# Patient Record
Sex: Male | Born: 1955 | Race: White | Hispanic: No | Marital: Married | Smoking: Never smoker
Health system: Southern US, Community
[De-identification: ages and names within clinical notes are randomized; demographics above are authoritative.]

## PROBLEM LIST (undated history)

## (undated) DIAGNOSIS — E7800 Pure hypercholesterolemia, unspecified: Secondary | ICD-10-CM

## (undated) DIAGNOSIS — I10 Essential (primary) hypertension: Secondary | ICD-10-CM

## (undated) DIAGNOSIS — F419 Anxiety disorder, unspecified: Secondary | ICD-10-CM

## (undated) HISTORY — PX: TONSILLECTOMY: SUR1361

## (undated) HISTORY — PX: CHOLECYSTECTOMY: SHX55

## (undated) HISTORY — PX: OTHER SURGICAL HISTORY: SHX169

## (undated) HISTORY — PX: NASAL SEPTUM SURGERY: SHX37

## (undated) HISTORY — PX: PALATE / UVULA BIOPSY / EXCISION: SUR128

---

## 2001-05-20 DIAGNOSIS — I10 Essential (primary) hypertension: Secondary | ICD-10-CM | POA: Insufficient documentation

## 2003-10-16 DIAGNOSIS — E291 Testicular hypofunction: Secondary | ICD-10-CM | POA: Insufficient documentation

## 2006-09-17 ENCOUNTER — Ambulatory Visit: Payer: Self-pay | Admitting: Gastroenterology

## 2006-12-21 LAB — HM COLONOSCOPY: HM COLON: NORMAL

## 2007-01-28 DIAGNOSIS — G4733 Obstructive sleep apnea (adult) (pediatric): Secondary | ICD-10-CM | POA: Insufficient documentation

## 2007-06-13 ENCOUNTER — Other Ambulatory Visit: Payer: Self-pay

## 2007-06-13 ENCOUNTER — Observation Stay: Payer: Self-pay | Admitting: Unknown Physician Specialty

## 2007-10-22 ENCOUNTER — Ambulatory Visit: Payer: Self-pay | Admitting: Pain Medicine

## 2007-10-29 ENCOUNTER — Ambulatory Visit: Payer: Self-pay | Admitting: Pain Medicine

## 2008-01-08 ENCOUNTER — Ambulatory Visit: Payer: Self-pay | Admitting: Pain Medicine

## 2008-01-22 ENCOUNTER — Ambulatory Visit: Payer: Self-pay | Admitting: Physician Assistant

## 2008-02-19 ENCOUNTER — Ambulatory Visit: Payer: Self-pay | Admitting: Pain Medicine

## 2008-02-23 ENCOUNTER — Ambulatory Visit: Payer: Self-pay | Admitting: Pain Medicine

## 2008-02-26 ENCOUNTER — Ambulatory Visit: Payer: Self-pay | Admitting: Pain Medicine

## 2008-05-12 ENCOUNTER — Ambulatory Visit: Payer: Self-pay | Admitting: Unknown Physician Specialty

## 2008-07-22 IMAGING — CR DG CHEST 2V
1 series · 2 of 2 positions shown · non-contrast
Comparison: none

REASON FOR EXAM: preop
COMMENTS:

PROCEDURE:     DXR - DXR CHEST PA (OR AP) AND LATERAL  - June 13, 2007 [DATE]
RESULT:     The lung fields are clear. The heart, mediastinal and osseous
structures show no significant abnormalities. Incidental note is made of
mild degenerative spurring at the T10-T11 level of the thoracic spine.

[Series 1: view not recorded · 0.17mm/px · 2 of 2 slices shown]
[im 1/2]
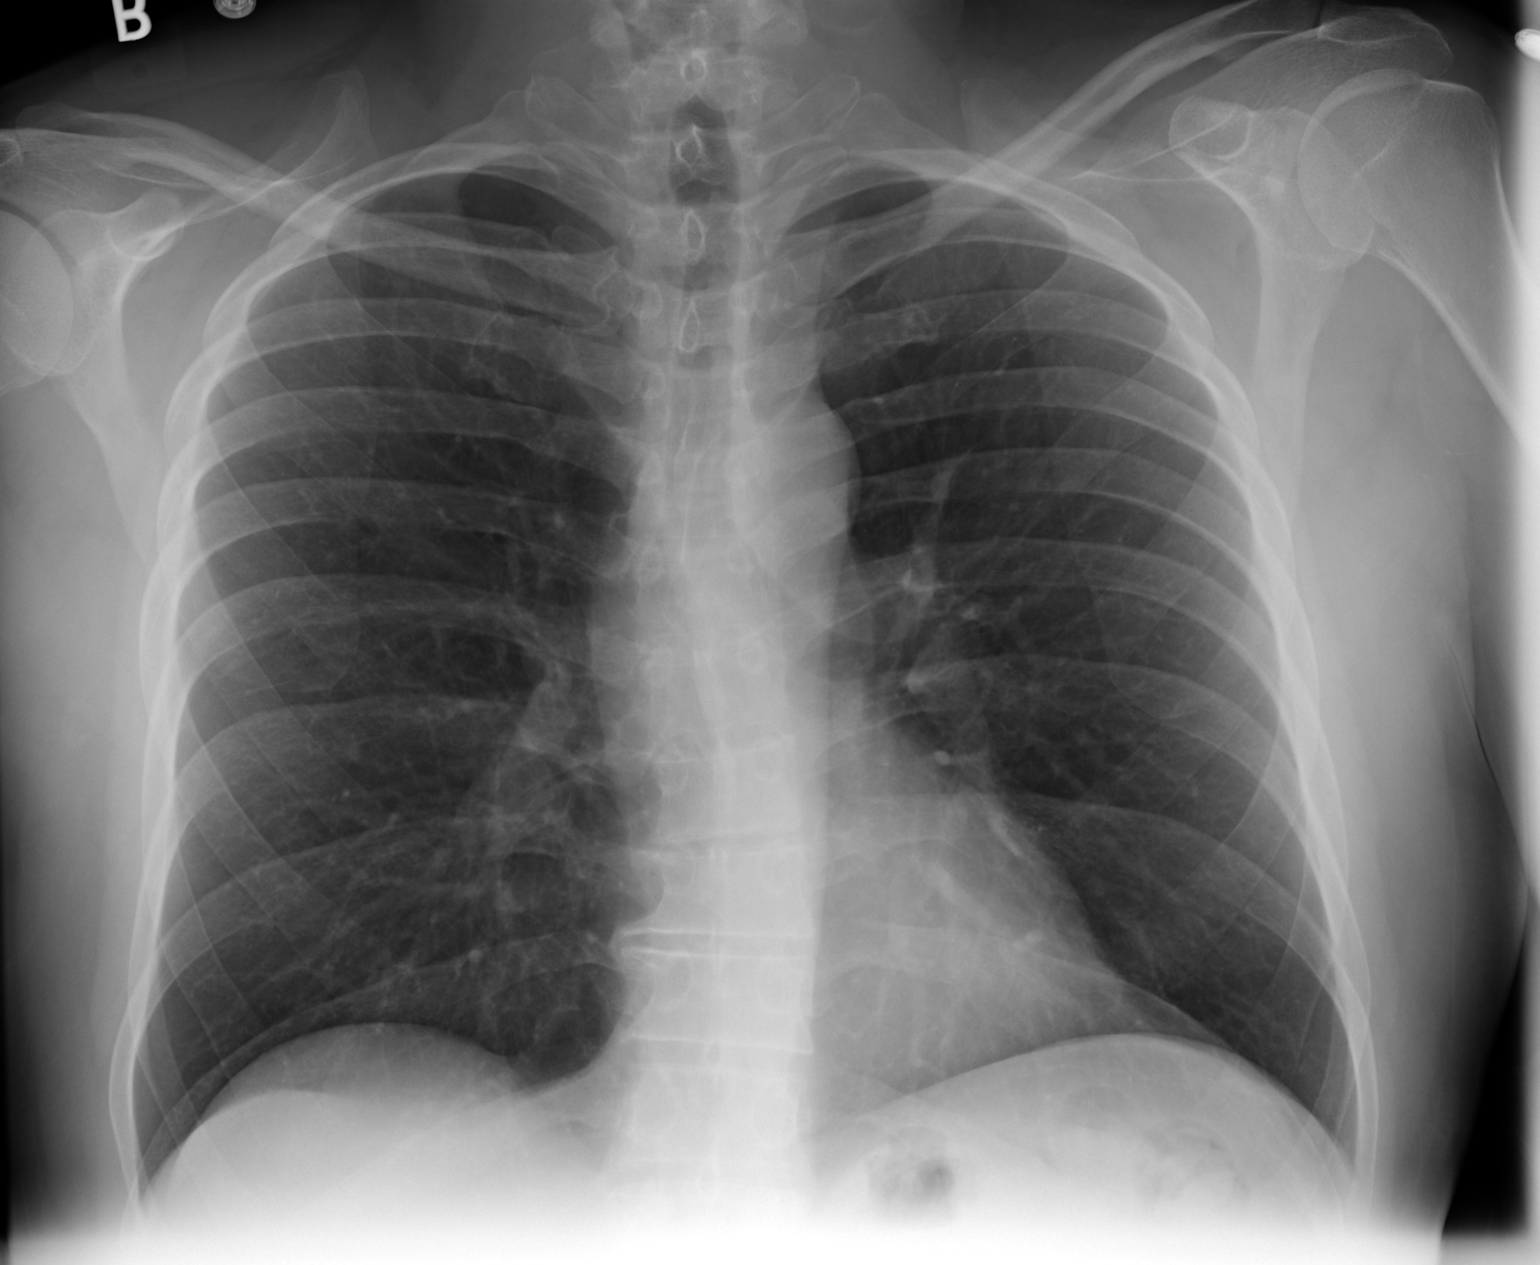
[im 2/2]
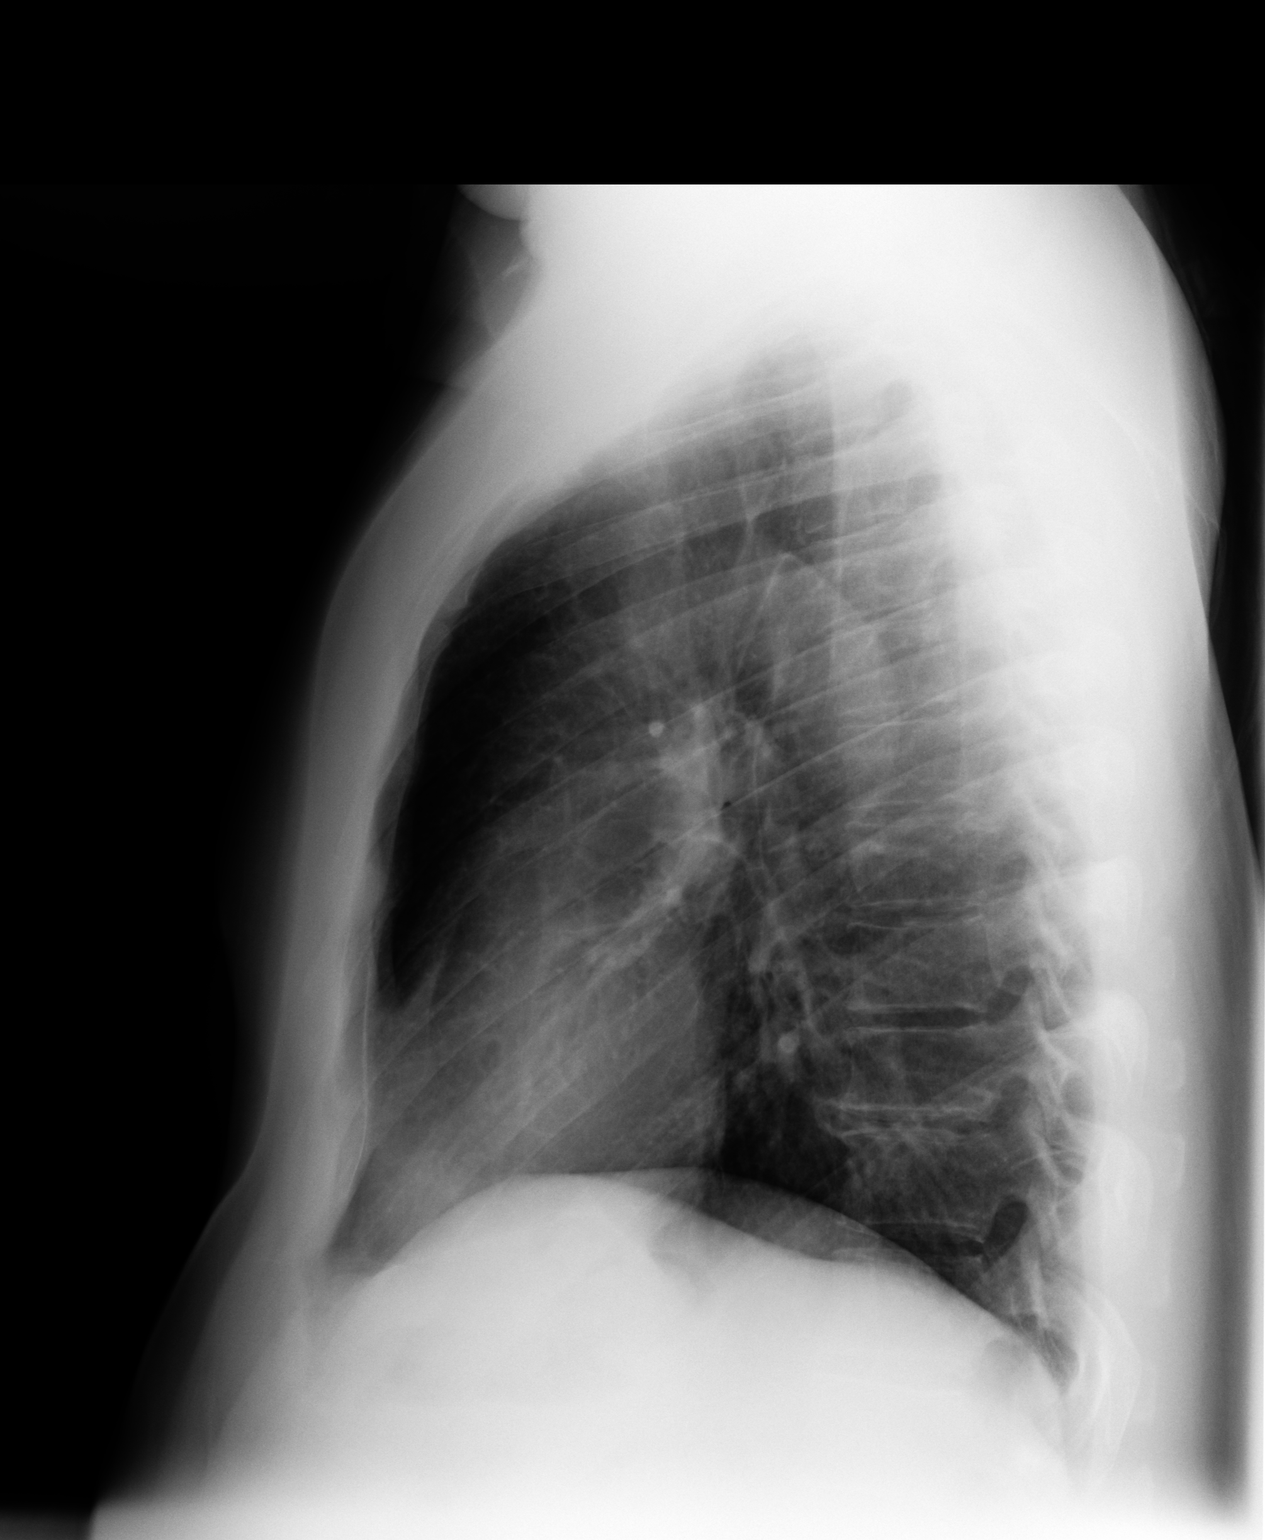

[2 of 2 positions shown; findings below may reference images not displayed]

IMPRESSION: 1.     No acute changes are identified.

## 2009-06-21 IMAGING — CR DG ARTHROGRAM WRIST*R*
1 series · 1 of 1 positions shown · non-contrast
Comparison: No comparison

REASON FOR EXAM: right wrist pain
COMMENTS:

PROCEDURE:     FL  - FL INJEC PROC RT WRIST ART  - May 12, 2008 [DATE]
RESULT:     Examination: Right wrist arthrogram
HISTORY: Prior right distal forearm trauma
TECHNIQUE: The risks and benefits of the procedure were discussed with the patient, and
written informed consent was obtained. The patient stated no history of
allergy to contrast media. A formal timeout procedure was performed with the
patient according to departmental protocol.

[view not recorded]
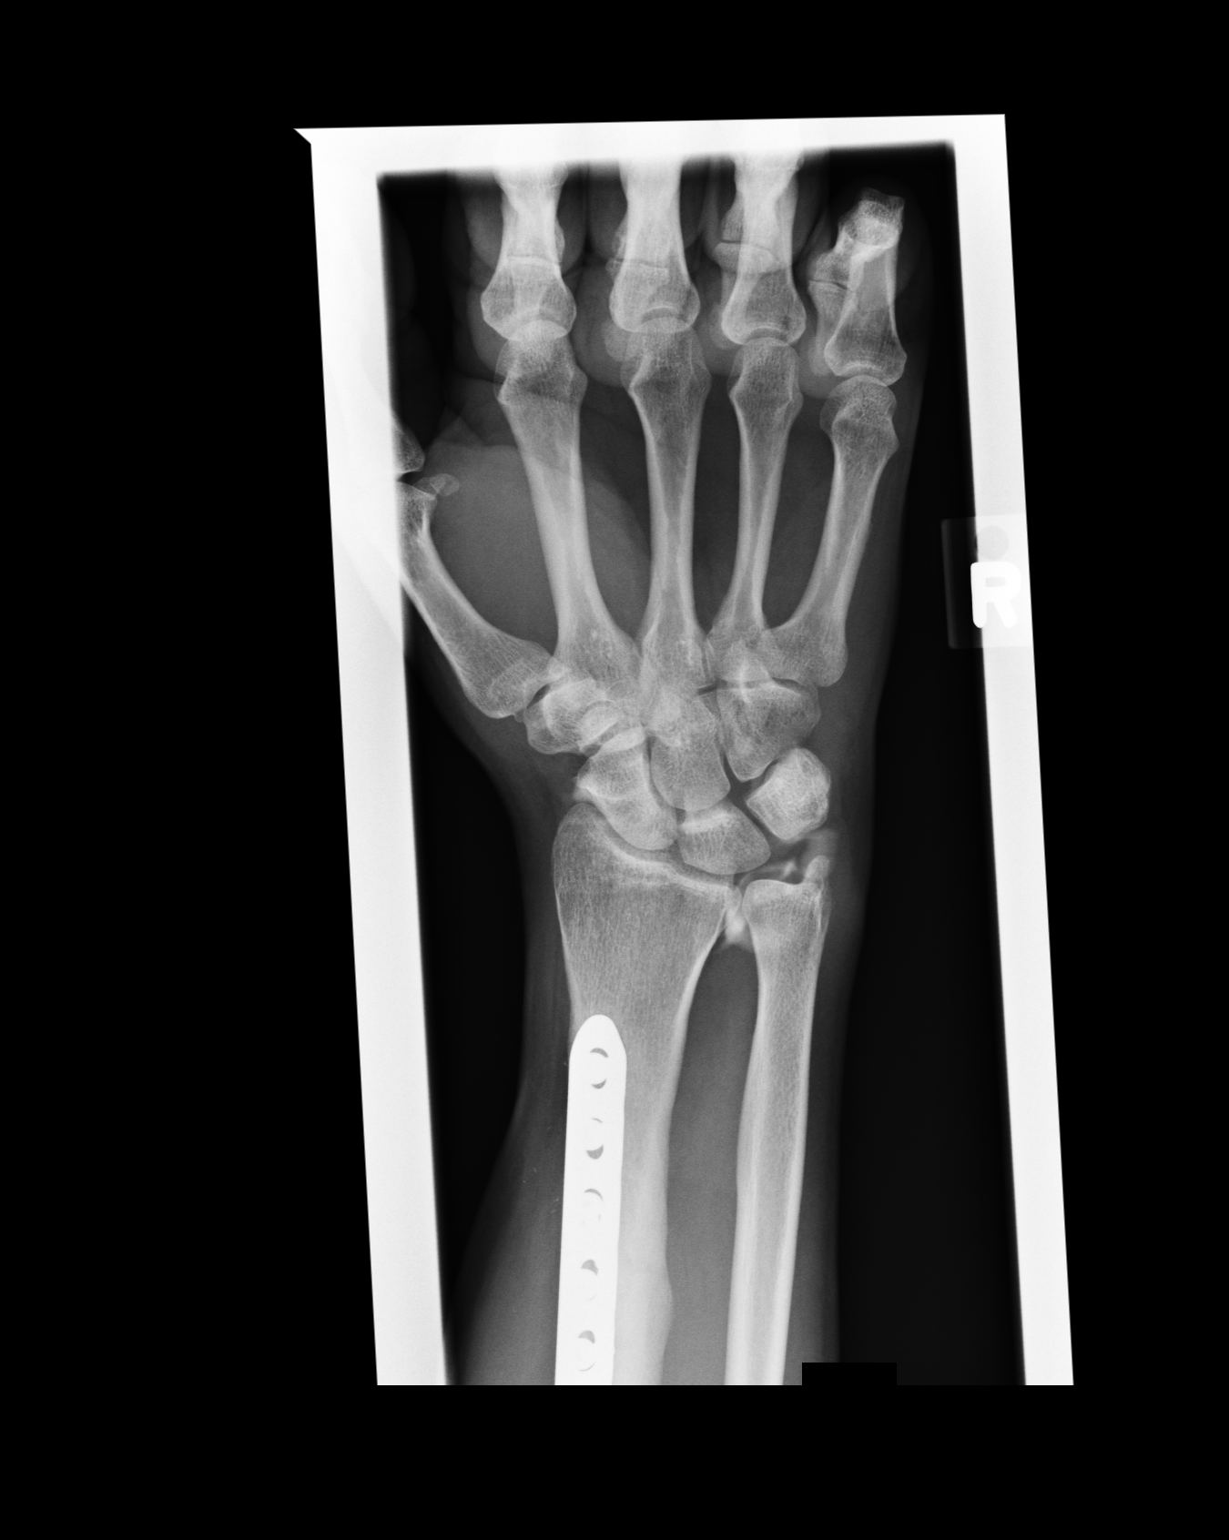

[1 of 1 positions shown; findings below may reference images not displayed]

The patient was placed supine on the fluoroscopy table and the right
radioscaphoid joint was identified under fluoroscopy. The skin overlying the
right radioscaphoid joint was subsequently cleaned with chloraprep and a
sterile drape was placed over the area of interest. Local anesthetic
(lidocaine) was used to numb the skin around the needle insertion site.

A 20-gauge needle was inserted into the right radioscaphoid joint under
fluoroscopy. Position was confirmed with injection of a small amount of
Optiray under fluoroscopy.

2 ml of gadolinium mixture (0.1 ml of gadolinium mixed with 20 ml of sterile
saline) was injected into the right radioscaphoid joint.

The wrist was exercised and contrast was seen extending into the distal
radial ulnar joint consistent with a tear of the TFC.

The needle was removed and hemostasis was achieved.The patient was
subsequently transferred to MRI for imaging.
IMPRESSION: Successful right wrist arthrogram for MRI without complication. Contrast is
seen extending from the radiocarpal joint into the distal radial ulnar joint
most consistent with a TFC tear.

## 2011-09-27 HISTORY — PX: UPPER GI ENDOSCOPY: SHX6162

## 2014-12-20 LAB — BASIC METABOLIC PANEL
BUN: 18 mg/dL (ref 4–21)
Creatinine: 1.1 mg/dL (ref 0.6–1.3)
GLUCOSE: 104 mg/dL
Sodium: 138 mmol/L (ref 137–147)

## 2014-12-20 LAB — LIPID PANEL
Cholesterol: 158 mg/dL (ref 0–200)
HDL: 58 mg/dL (ref 35–70)
LDL CALC: 75 mg/dL
LDL/HDL RATIO: 1.3
Triglycerides: 127 mg/dL (ref 40–160)

## 2015-12-19 ENCOUNTER — Telehealth: Payer: Self-pay

## 2015-12-19 NOTE — Telephone Encounter (Signed)
Patient reports upper mid abdominal pain, bloating, and a significant amount of blood in stool this morning. Patient denies dizziness, nausea, and syncope.Discussed with Dr. Sherrie MustacheFisher. Per Dr. Sherrie MustacheFisher advised patient to go to the ER for further evaluation and a CT scan to rule out blooding diverticulitis. Patient verbalized understanding, but states he was going to call his insurance company to determine coverage of a ER visit.

## 2015-12-20 ENCOUNTER — Ambulatory Visit
Admission: EM | Admit: 2015-12-20 | Discharge: 2015-12-20 | Disposition: A | Discharge status: Home / Self Care | Payer: 59 | Attending: Family Medicine | Admitting: Family Medicine

## 2015-12-20 ENCOUNTER — Emergency Department
Admission: EM | Admit: 2015-12-20 | Discharge: 2015-12-20 | Disposition: A | Discharge status: Home / Self Care | Payer: 59 | Attending: Emergency Medicine | Admitting: Emergency Medicine

## 2015-12-20 ENCOUNTER — Other Ambulatory Visit: Payer: Self-pay

## 2015-12-20 ENCOUNTER — Emergency Department: Payer: 59

## 2015-12-20 DIAGNOSIS — K625 Hemorrhage of anus and rectum: Secondary | ICD-10-CM | POA: Diagnosis not present

## 2015-12-20 DIAGNOSIS — R197 Diarrhea, unspecified: Secondary | ICD-10-CM | POA: Diagnosis not present

## 2015-12-20 DIAGNOSIS — K649 Unspecified hemorrhoids: Secondary | ICD-10-CM | POA: Diagnosis not present

## 2015-12-20 DIAGNOSIS — R1084 Generalized abdominal pain: Secondary | ICD-10-CM

## 2015-12-20 DIAGNOSIS — R1013 Epigastric pain: Secondary | ICD-10-CM | POA: Diagnosis present

## 2015-12-20 DIAGNOSIS — K219 Gastro-esophageal reflux disease without esophagitis: Secondary | ICD-10-CM

## 2015-12-20 DIAGNOSIS — R1310 Dysphagia, unspecified: Secondary | ICD-10-CM | POA: Diagnosis not present

## 2015-12-20 DIAGNOSIS — Z79899 Other long term (current) drug therapy: Secondary | ICD-10-CM | POA: Insufficient documentation

## 2015-12-20 DIAGNOSIS — I10 Essential (primary) hypertension: Secondary | ICD-10-CM | POA: Insufficient documentation

## 2015-12-20 HISTORY — DX: Anxiety disorder, unspecified: F41.9

## 2015-12-20 HISTORY — DX: Pure hypercholesterolemia, unspecified: E78.00

## 2015-12-20 HISTORY — DX: Essential (primary) hypertension: I10

## 2015-12-20 LAB — CBC WITH DIFFERENTIAL/PLATELET
BASOS ABS: 0 10*3/uL (ref 0–0.1)
Basophils Relative: 0 %
EOS ABS: 0.2 10*3/uL (ref 0–0.7)
Eosinophils Relative: 2 %
HEMATOCRIT: 39.8 % — AB (ref 40.0–52.0)
Hemoglobin: 13.8 g/dL (ref 13.0–18.0)
LYMPHS ABS: 4.4 10*3/uL — AB (ref 1.0–3.6)
Lymphocytes Relative: 52 %
MCH: 31 pg (ref 26.0–34.0)
MCHC: 34.7 g/dL (ref 32.0–36.0)
MCV: 89.3 fL (ref 80.0–100.0)
MONOS PCT: 13 %
Monocytes Absolute: 1.1 10*3/uL — ABNORMAL HIGH (ref 0.2–1.0)
NEUTROS ABS: 2.8 10*3/uL (ref 1.4–6.5)
Neutrophils Relative %: 33 %
PLATELETS: 300 10*3/uL (ref 150–440)
RBC: 4.46 MIL/uL (ref 4.40–5.90)
RDW: 13.6 % (ref 11.5–14.5)
WBC: 8.5 10*3/uL (ref 3.8–10.6)

## 2015-12-20 LAB — COMPREHENSIVE METABOLIC PANEL
ALK PHOS: 143 U/L — AB (ref 38–126)
ALT: 44 U/L (ref 17–63)
AST: 42 U/L — AB (ref 15–41)
Albumin: 3 g/dL — ABNORMAL LOW (ref 3.5–5.0)
Anion gap: 6 (ref 5–15)
BILIRUBIN TOTAL: 0.7 mg/dL (ref 0.3–1.2)
BUN: 19 mg/dL (ref 6–20)
CHLORIDE: 102 mmol/L (ref 101–111)
CO2: 26 mmol/L (ref 22–32)
Calcium: 7.6 mg/dL — ABNORMAL LOW (ref 8.9–10.3)
Creatinine, Ser: 1.16 mg/dL (ref 0.61–1.24)
GFR calc Af Amer: >60 mL/min (ref >60)
GLUCOSE: 121 mg/dL — AB (ref 65–99)
Potassium: 4.1 mmol/L (ref 3.5–5.1)
Sodium: 134 mmol/L — ABNORMAL LOW (ref 135–145)
Total Protein: 6 g/dL — ABNORMAL LOW (ref 6.5–8.1)

## 2015-12-20 LAB — TROPONIN I

## 2015-12-20 LAB — ABO/RH: ABO/RH(D): A POS

## 2015-12-20 LAB — CBC
HEMATOCRIT: 40.7 % (ref 40.0–52.0)
HEMOGLOBIN: 13.7 g/dL (ref 13.0–18.0)
MCH: 29.8 pg (ref 26.0–34.0)
MCHC: 33.5 g/dL (ref 32.0–36.0)
MCV: 88.8 fL (ref 80.0–100.0)
Platelets: 304 10*3/uL (ref 150–440)
RBC: 4.59 MIL/uL (ref 4.40–5.90)
RDW: 13.4 % (ref 11.5–14.5)
WBC: 9.6 10*3/uL (ref 3.8–10.6)

## 2015-12-20 LAB — TYPE AND SCREEN
ABO/RH(D): A POS
Antibody Screen: NEGATIVE

## 2015-12-20 LAB — LIPASE, BLOOD: LIPASE: 22 U/L (ref 11–51)

## 2015-12-20 MED ORDER — GI COCKTAIL ~~LOC~~
30.0000 mL | Freq: Once | ORAL | Status: AC
  Administered 2015-12-20: 30 mL via ORAL
  Filled 2015-12-20: qty 30

## 2015-12-20 MED ORDER — IOHEXOL 350 MG/ML SOLN
100.0000 mL | Freq: Once | INTRAVENOUS | Status: AC | PRN
  Administered 2015-12-20: 100 mL via INTRAVENOUS
  Filled 2015-12-20: qty 100

## 2015-12-20 MED ORDER — PANTOPRAZOLE SODIUM 40 MG PO TBEC: 40.0000 mg | DELAYED_RELEASE_TABLET | Freq: Every day | ORAL | Status: DC

## 2015-12-20 MED ORDER — IOHEXOL 240 MG/ML SOLN
25.0000 mL | Freq: Once | INTRAMUSCULAR | Status: AC | PRN
  Administered 2015-12-20: 25 mL via ORAL
  Filled 2015-12-20: qty 25

## 2015-12-20 NOTE — ED Provider Notes (Signed)
[facility] Emergency Department Provider Note  ____________________________________________   I have reviewed the triage vital signs and the nursing notes.   HISTORY  Chief Complaint Abdominal Pain and Blood In Stools    HPI Kurt Edwards is a 60 y.o. male with a history of hemorrhoids and acid indigestion states last weekend week and a half he has been having epigastric abdominal pain with spicy food. Worse than normal. No hematemesis. No diarrhea. He states then this morning had one stool that was slightly loose today with some blood in it. It was not melanotic. He denies any fevers or chills. He states he has no chest pain or shortness of breath. He has not had a repeat bloody stool. Patient states he did have a colonoscopy many years ago and it was negative to the extent that he can recall any bleeds he has had an endoscopy before. He does not take any antacids for his chronic epigastric burning discomfort with spicy foods rather he tries to avoid food that set him off. He has not had any lightheadedness or other symptoms of decompensated GI bleed. He denies any rectal pain. Denies any lower abdominal pain.  Past Medical History  Diagnosis Date  . Anxiety   . Hypertension   . Hypercholesteremia     There are no active problems to display for this patient.   Past Surgical History  Procedure Laterality Date  . Tonsillectomy    . Cholecystectomy    . Palate / uvula biopsy / excision      Current Outpatient Rx  Name  Route  Sig  Dispense  Refill  . amphetamine-dextroamphetamine (ADDERALL) 30 MG tablet   Oral   Take 30 mg by mouth daily.         . DULoxetine (CYMBALTA) 20 MG capsule   Oral   Take 60 mg by mouth daily.         Marland Kitchen lamoTRIgine (LAMICTAL) 100 MG tablet   Oral   Take 300 mg by mouth daily.         Marland Kitchen losartan (COZAAR) 100 MG tablet   Oral   Take 100 mg by mouth daily.         . rosuvastatin (CRESTOR) 5 MG tablet   Oral    Take 5 mg by mouth daily.           Allergies Review of patient's allergies indicates no known allergies.  Family History  Problem Relation Age of Onset  . Hypertension Mother   . Emphysema Father     Social History Social History  Substance Use Topics  . Smoking status: Never Smoker   . Smokeless tobacco: None  . Alcohol Use: No    Review of Systems Constitutional: No fever/chills Eyes: No visual changes. ENT: No sore throat. No stiff neck no neck pain Cardiovascular: Denies chest pain. Respiratory: Denies shortness of breath. Gastrointestinal:   no vomiting.  See history of present illness  No constipation. Genitourinary: Negative for dysuria. Musculoskeletal: Negative lower extremity swelling Skin: Negative for rash. Neurological: Negative for headaches, focal weakness or numbness. 10-point ROS otherwise negative.  ____________________________________________   PHYSICAL EXAM:  VITAL SIGNS: ED Triage Vitals  Enc Vitals Group     BP 12/20/15 1005 104/70 mmHg     Pulse Rate 12/20/15 1005 50     Resp 12/20/15 1005 18     Temp 12/20/15 1005 98 F (36.7 C)     Temp Source 12/20/15 1005 Oral  SpO2 12/20/15 1005 97 %     Weight 12/20/15 1005 188 lb (85.276 kg)     Height 12/20/15 1005 5\' 11"  (1.803 m)     Head Cir --      Peak Flow --      Pain Score 12/20/15 1006 3     Pain Loc --      Pain Edu? --      Excl. in GC? --     Constitutional: Alert and oriented. Well appearing and in no acute distress. Eyes: Conjunctivae are normal. PERRL. EOMI. Head: Atraumatic. Nose: No congestion/rhinnorhea. Mouth/Throat: Mucous membranes are moist.  Oropharynx non-erythematous. Neck: No stridor.   Nontender with no meningismus Cardiovascular: Normal rate, regular rhythm. Grossly normal heart sounds.  Good peripheral circulation. Respiratory: Normal respiratory effort.  No retractions. Lungs CTAB. Abdominal: Soft and slightly tender to palpation epigastric region. No  distention. No guarding no rebound Rectal exam: Evidence of old hemorrhoids noted, no obvious hemorrhoidal bleeding noted, brown stool faintly guaiac positive nontender no masses Back:  There is no focal tenderness or step off there is no midline tenderness there are no lesions noted. there is no CVA tenderness Musculoskeletal: No lower extremity tenderness. No joint effusions, no DVT signs strong distal pulses no edema Neurologic:  Normal speech and language. No gross focal neurologic deficits are appreciated.  Skin:  Skin is warm, dry and intact. No rash noted. Psychiatric: Mood and affect are normal. Speech and behavior are normal.  ____________________________________________   LABS (all labs ordered are listed, but only abnormal results are displayed)  Labs Reviewed  COMPREHENSIVE METABOLIC PANEL - Abnormal; Notable for the following:    Sodium 134 (*)    Glucose, Bld 121 (*)    Calcium 7.6 (*)    Total Protein 6.0 (*)    Albumin 3.0 (*)    AST 42 (*)    Alkaline Phosphatase 143 (*)    All other components within normal limits  CBC WITH DIFFERENTIAL/PLATELET - Abnormal; Notable for the following:    HCT 39.8 (*)    All other components within normal limits  CBC  TROPONIN I  LIPASE, BLOOD  TYPE AND SCREEN  ABO/RH   ____________________________________________  EKG  I personally interpreted any EKGs ordered by me or triage Normal sinus rhythm at 77 beats minute no acute ST elevation or acute ST depression no acute ischemia ____________________________________________  RADIOLOGY  I reviewed any imaging ordered by me or triage that were performed during my shift and, if possible, patient and/or family made aware of any abnormal findings. ____________________________________________   PROCEDURES  Procedure(s) performed: None  Critical Care performed: None  ____________________________________________   INITIAL IMPRESSION / ASSESSMENT AND PLAN / ED  COURSE  Pertinent labs & imaging results that were available during my care of the patient were reviewed by me and considered in my medical decision making (see chart for details).  Patient here with chronic epigastric pain relieved with a GI cocktail. No evidence of ACS PE or dissection or intrathoracic pathology today. Very reproducible very manageable with antacids and very much attributable to spicy foods as an outpatient. Patient will need outpatient follow-up for this but there is no evidence of acute pathology such as upper GI bleed. Patient has had no melena, he does not have evidence of melena at this time nor is there evidence of an acute upper GI bleed. He did have some bright red blood per rectum today, this certainly could be a terminal to an  internal hemorrhoid which I cannot palpate or could be from other pathology is yet identified. Very low suspicion however evident upper GI bleed causing a brisk hemorrhage resulting in lower bright red blood per rectum. CT scan is negative for diverticular disease or other acute pathology serial H&H is normal the emergency room. Patient will need to be started on anti-acids, and we have discussed with Dr. Shelle Iron of GI medicine who agrees with plan, discharge and outpatient follow-up. Extensive return precautions given and understood. ____________________________________________   FINAL CLINICAL IMPRESSION(S) / ED DIAGNOSES  Final diagnoses:  None      This chart was dictated using voice recognition software.  Despite best efforts to proofread,  errors can occur which can change meaning.     Jeanmarie Plant, MD 12/20/15 [phone]

## 2015-12-20 NOTE — ED Notes (Signed)
Pt c/o epigastric food for a couple of week and yesterday had bloody loose stool x1 . States he is suppose to be leaving for a cruise and did not want to leave without begin checked out..Marland Kitchen

## 2015-12-20 NOTE — ED Notes (Signed)
Examined by Dr. Thurmond ButtsWade. Family states [facility] have to be done through [facility]. Dr. Thurmond ButtsWade highly recommended [facility] for further evaluation. Dr. Thurmond ButtsWade called [facility]

## 2015-12-20 NOTE — ED Notes (Signed)
Notified lab to add on the additional orders-lipase and troponin

## 2015-12-20 NOTE — ED Notes (Signed)
Upper abdominal/epigastric pain intermittent x 2 weeks, agravated by swallowing food. Yesterday morning had BM with large amount of bright red in toilet. Continues with epigastric pain and bloating

## 2015-12-20 NOTE — ED Notes (Signed)
To CT via stretcher

## 2015-12-20 NOTE — ED Provider Notes (Signed)
CSN: [id number]     Arrival date & time 12/20/15  1191 History   First MD Initiated Contact with Patient 12/20/15 0901    Nurses notes were reviewed. Chief Complaint  Patient presents with  . Rectal Bleeding   Patient is here because of rectal bleeding yesterday. States that he having abdominal pains distention had a bowel movement yesterday and told was full of blood. Along with blood he's had pain in the epigastric area states discomfort when he eats physical things stuck in his esophagus and stomach. Reports not feeling well. He had a colonoscopy about 10 years ago went to him if it was okay then and is due to have another one about a year so. He states he also had endoscopy but does not when that was done. He reports feeling bloated and cramping as well. Was really concerned about the mild blood in his and his tall. He states that his primary care doctor, come here we have CT scan and there was available to [place] 24/7.     (Consider location/radiation/quality/duration/timing/severity/associated sxs/prior Treatment) Patient is a 60 y.o. male presenting with hematochezia and abdominal pain. The history is provided by the patient and the spouse. No language interpreter was used.  Rectal Bleeding Quality:  Bright red Duration:  2 days Timing:  Intermittent Progression:  Waxing and waning Chronicity:  New Context: defecation, diarrhea and hemorrhoids   Similar prior episodes: no   Relieved by:  Nothing Worsened by:  Nothing tried Ineffective treatments:  None tried Associated symptoms: abdominal pain   Abdominal Pain Pain location:  Epigastric Pain quality: bloating, cramping, fullness and pressure   Pain radiates to:  Does not radiate Pain severity:  Moderate Duration:  2 days Timing:  Constant Progression:  Worsening Context: eating   Context comment:  Difficulty eating and swallowing states she has discomfort when he does have Relieved by:  Nothing Worsened by:  Bowel  movements Associated symptoms: hematochezia     Past Medical History  Diagnosis Date  . Anxiety   . Hypertension   . Hypercholesteremia    Past Surgical History  Procedure Laterality Date  . Tonsillectomy    . Cholecystectomy    . Palate / uvula biopsy / excision     Family History  Problem Relation Age of Onset  . Hypertension Mother   . Emphysema Father    Social History  Substance Use Topics  . Smoking status: Never Smoker   . Smokeless tobacco: None  . Alcohol Use: No    Review of Systems  Gastrointestinal: Positive for abdominal pain and hematochezia.  All other systems reviewed and are negative.   Allergies  Review of patient's allergies indicates no known allergies.  Home Medications   Prior to Admission medications   Medication Sig Start Date End Date Taking? Authorizing Provider  amphetamine-dextroamphetamine (ADDERALL) 30 MG tablet Take 30 mg by mouth daily.   Yes Historical Provider, MD  DULoxetine (CYMBALTA) 20 MG capsule Take 60 mg by mouth daily.   Yes Historical Provider, MD  lamoTRIgine (LAMICTAL) 100 MG tablet Take 300 mg by mouth daily.   Yes Historical Provider, MD  losartan (COZAAR) 100 MG tablet Take 100 mg by mouth daily.   Yes Historical Provider, MD  rosuvastatin (CRESTOR) 5 MG tablet Take 5 mg by mouth daily.   Yes Historical Provider, MD  pantoprazole (PROTONIX) 40 MG tablet Take 1 tablet (40 mg total) by mouth daily. 12/20/15 12/19/16  Jeanmarie Plant, MD   Meds Ordered  and Administered this Visit  Medications - No data to display  BP 113/68 mmHg  Pulse 92  Temp(Src) 96.6 F (35.9 C) (Tympanic)  Resp 16  Ht  (1.803 m)  Wt 187 lb (84.823 kg)  BMI 26.09 kg/m2  SpO2 99% No data found.   Physical Exam  Constitutional: He is oriented to person, place, and time. He appears well-developed and well-nourished.  HENT:  Head: Normocephalic.  Eyes: Pupils are equal, round, and reactive to light.  Neck: Normal range of motion.   Cardiovascular: Normal rate, regular rhythm and normal heart sounds.   Pulmonary/Chest: Effort normal and breath sounds normal.  Abdominal: Soft. There is no hepatosplenomegaly. There is tenderness. There is no CVA tenderness. No hernia. Hernia confirmed negative in the ventral area.    Musculoskeletal: Normal range of motion. He exhibits no edema.  Neurological: He is alert and oriented to person, place, and time.  Skin: Skin is warm and dry. No erythema.  Psychiatric: He has a normal mood and affect.  Vitals reviewed.   ED Course  Procedures (including critical care time)  Labs Review Labs Reviewed - No data to display  Imaging Review Ct Abdomen Pelvis W Contrast  12/20/2015  EXAM: CT ABDOMEN AND PELVIS WITH CONTRAST TECHNIQUE: Multidetector CT imaging of the abdomen and pelvis was performed using the standard protocol following bolus administration of intravenous contrast. CONTRAST:  OMNIPAQUE IOHEXOL 350 MG/ML SOLN COMPARISON:  None. FINDINGS: Lung bases: 5 mm subpleural nodule in the anterior right middle lobe. Mild linear and reticular opacity in the lung bases mostly in the right lower lobe, consistent with atelectasis. Heart normal in size. Hepatobiliary: New small low-density lesions in the left lobe anteriorly, likely cysts. Liver otherwise unremarkable. Gallbladder surgically absent. No bile duct dilation. Spleen: Borderline enlarged measuring 13.1 x 6.1 x 11.5 cm. Small, 9 mm, cystic lesion along the lateral margin of the spleen. Pancreas and adrenal glands:  Unremarkable. Kidneys, ureters, bladder:  Normal. Lymph nodes: No pathologically enlarged lymph nodes. There are mildly prominent mesenteric lymph nodes none enlarged by size criteria. Vascular: Mild atherosclerotic calcifications along the infrarenal abdominal aorta. No aneurysm. Ascites: None. Gastrointestinal: Normal stomach. Small bowel and colon are unremarkable. Normal appendix is visualized. Musculoskeletal:  Degenerative changes noted of the lower lumbar spine. No osteoblastic or osteolytic lesions. IMPRESSION: 1. No acute findings. No findings to explain bright red blood per rectum or abdominal pain. 2. Status post cholecystectomy. 3. Small low-density lesions in the left liver lobe, likely cysts. 4. Small low-density spleen lesion likely a cyst. Spleen is borderline enlarged. Electronically Signed   By: Amie Portland M.D.   On: 12/20/2015 16:01     Visual Acuity Review  Right Eye Distance:   Left Eye Distance:   Bilateral Distance:    Right Eye Near:   Left Eye Near:    Bilateral Near:         MDM   1. Generalized abdominal pain   2. Rectal bleeding   3. Diarrhea, unspecified type   4. Dysphagia      Explained to him and his wife that significant GI bleed which be handling the ER and not an urgent care. Also the mild abdominal pain and a change in abdominal pain he's probably need IV contrast CT done which would take several hours as well. Once again noted is one not to have it done at urgent care. They had questions about the elbow to CT scan of also explained to them  that we have to get prior approval unlikely ER. 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 This point time as long suggest they go to the ER for evaluation and management. ER triage nurse was informed of their [facility].  Hassan RowanEugene Cintia Gleed, MD 12/20/15 2235

## 2015-12-20 NOTE — Discharge Instructions (Signed)

## 2015-12-21 DIAGNOSIS — K4090 Unilateral inguinal hernia, without obstruction or gangrene, not specified as recurrent: Secondary | ICD-10-CM | POA: Insufficient documentation

## 2015-12-21 DIAGNOSIS — F32A Depression, unspecified: Secondary | ICD-10-CM | POA: Insufficient documentation

## 2015-12-21 DIAGNOSIS — K219 Gastro-esophageal reflux disease without esophagitis: Secondary | ICD-10-CM | POA: Insufficient documentation

## 2015-12-21 DIAGNOSIS — F329 Major depressive disorder, single episode, unspecified: Secondary | ICD-10-CM | POA: Insufficient documentation

## 2015-12-23 ENCOUNTER — Ambulatory Visit: Payer: Self-pay | Admitting: Family Medicine

## 2016-01-13 ENCOUNTER — Encounter: Payer: Self-pay | Admitting: Family Medicine

## 2016-02-13 ENCOUNTER — Encounter: Payer: Self-pay | Admitting: Family Medicine

## 2016-06-07 ENCOUNTER — Telehealth: Payer: Self-pay | Admitting: Family Medicine

## 2016-06-08 NOTE — Telephone Encounter (Signed)
No FU scheduled. No previous OV in Epic. Allene DillonEmily Drozdowski, CMA

## 2016-06-08 NOTE — Telephone Encounter (Signed)
Patient has not been seen since January 2016. He needs to schedule follow up o.v.  Before we can approve refills.

## 2016-06-08 NOTE — Telephone Encounter (Signed)
Pt advised as below. Is calling back to schedule. Informed me refills will be provided when he schedules FU. Allene DillonEmily Drozdowski, CMA

## 2016-06-19 NOTE — Telephone Encounter (Signed)
Pt made a f/u visit for 06/26/16 which was the first available appt. [facility]. Please advise. Thanks TNP

## 2016-06-20 MED ORDER — LOSARTAN POTASSIUM 100 MG PO TABS: 100.0000 mg | ORAL_TABLET | Freq: Every day | ORAL | 0 refills | Status: DC

## 2016-06-20 MED ORDER — ROSUVASTATIN CALCIUM 5 MG PO TABS: 5.0000 mg | ORAL_TABLET | Freq: Every day | ORAL | 0 refills | Status: DC

## 2016-06-26 ENCOUNTER — Encounter: Payer: Self-pay | Admitting: Family Medicine

## 2016-06-26 ENCOUNTER — Ambulatory Visit (INDEPENDENT_AMBULATORY_CARE_PROVIDER_SITE_OTHER): Payer: 59 | Admitting: Family Medicine

## 2016-06-26 VITALS — BP 120/82 | HR 88 | Temp 98.4°F | Resp 16 | Ht 72.0 in | Wt 183.0 lb

## 2016-06-26 DIAGNOSIS — M7989 Other specified soft tissue disorders: Secondary | ICD-10-CM | POA: Diagnosis not present

## 2016-06-26 DIAGNOSIS — J309 Allergic rhinitis, unspecified: Secondary | ICD-10-CM

## 2016-06-26 DIAGNOSIS — E785 Hyperlipidemia, unspecified: Secondary | ICD-10-CM

## 2016-06-26 DIAGNOSIS — I10 Essential (primary) hypertension: Secondary | ICD-10-CM

## 2016-06-26 DIAGNOSIS — Z125 Encounter for screening for malignant neoplasm of prostate: Secondary | ICD-10-CM | POA: Diagnosis not present

## 2016-06-26 MED ORDER — FLUTICASONE PROPIONATE 50 MCG/ACT NA SUSP: 2.0000 | Freq: Every day | NASAL | 6 refills | Status: DC

## 2016-06-26 NOTE — Progress Notes (Signed)
Patient: Kurt Edwards Male    DOB: April 22, 1956   60 y.o.   MRN: [id number] Visit Date: 06/26/2016  Today's Provider: Mila Merry, MD   Chief Complaint  Patient presents with  . Hypertension  . Anxiety  . Depression  . Hyperlipidemia   Subjective:    HPI  Hypertension, follow-up:  BP Readings from Last 3 Encounters:  06/26/16 120/82  11/03/14 122/76  12/20/15 121/88    He was last seen for hypertension on 11/03/2014. BP at that visit was 122/76. Management changes since that visit include no changes. He reports excellent compliance with treatment. He is not having side effects.  He is not exercising. He is adherent to low salt diet.   Outside blood pressures are stable. He is experiencing none.  Patient denies chest pain and lower extremity edema.   Cardiovascular risk factors include hypertension.  Use of agents associated with hypertension: none.     Weight trend: stable Wt Readings from Last 3 Encounters:  06/26/16 183 lb (83 kg)  11/03/14 179 lb (81.2 kg)  12/20/15 188 lb (85.3 kg)    Current diet: in general, a "healthy" diet    ------------------------------------------------------------------------     Depression/Anxiety, Follow-up  He  was last seen for this 11/03/2014 Changes made at last visit include no changes.   He reports excellent compliance with treatment. He is not having side effects.   He reports excellent tolerance of treatment. Current symptoms include: difficulty concentrating He feels he is Improved since last visit. Patient is being seen by Dr. Percell Belt, pt was started on Latuda 20 mg QD.  ------------------------------------------------------------------------   Lipid/Cholesterol, Follow-up:   Last seen for this 11/03/2014 Management changes since that visit include no changes. . Last Lipid Panel:    Component Value Date/Time   CHOL 158 12/20/2014   TRIG 127 12/20/2014   HDL 58 12/20/2014   LDLCALC 75  12/20/2014    Risk factors for vascular disease include hypertension  He reports excellent compliance with treatment. He is having side effects.  Current symptoms include none and have been stable. Weight trend: stable Prior visit with dietician: no Current diet: in general, a "healthy" diet   Current exercise: none  Wt Readings from Last 3 Encounters:  06/26/16 183 lb (83 kg)  11/03/14 179 lb (81.2 kg)  12/20/15 188 lb (85.3 kg)   -------------------------------------------------------------------  Right arm problem: Patient reports that about 3 weeks ago he was having right arm and shoulder pain. Patient reports pain lasted for about one week. Patient denies injury. Initially area was black and blue, weak and sore, which has since resolved, but remains swollen.    Allergies  Allergen Reactions  . Amlodipine Besylate Hives    Possible reaction to amlodipine  . Atorvastatin     Other reaction(s): Muscle Pain  . Losartan Swelling     Current Outpatient Prescriptions:  .  amphetamine-dextroamphetamine (ADDERALL) 30 MG tablet, Take 30 mg by mouth daily., Disp: , Rfl:  .  DULoxetine (CYMBALTA) 20 MG capsule, Take 60 mg by mouth daily., Disp: , Rfl:  .  losartan (COZAAR) 100 MG tablet, Take 1 tablet (100 mg total) by mouth daily., Disp: 30 tablet, Rfl: 0 .  lurasidone (LATUDA) 20 MG TABS tablet, Take 20 mg by mouth daily., Disp: , Rfl:  .  pantoprazole (PROTONIX) 40 MG tablet, Take 1 tablet (40 mg total) by mouth daily. (Patient taking differently: Take 40 mg by mouth as needed. ), Disp:  30 tablet, Rfl: 1 .  rosuvastatin (CRESTOR) 5 MG tablet, Take 1 tablet (5 mg total) by mouth daily., Disp: 30 tablet, Rfl: 0 .  tadalafil (CIALIS) 20 MG tablet, Take by mouth., Disp: , Rfl:   Review of Systems  Constitutional: Negative.   HENT: Positive for congestion, ear pain and rhinorrhea.   Cardiovascular: Negative.   Musculoskeletal: Positive for myalgias.  Allergic/Immunologic:  Positive for environmental allergies.  Psychiatric/Behavioral: Positive for decreased concentration.    Social History  Substance Use Topics  . Smoking status: Never Smoker  . Smokeless tobacco: Never Used  . Alcohol use No   Objective:   BP 120/82 (BP Location: Left Arm, Patient Position: Sitting, Cuff Size: Large)   Pulse 88   Temp 98.4 F (36.9 C) (Oral)   Resp 16   Ht 6' (1.829 m)   Wt 183 lb (83 kg)   SpO2 99%   BMI 24.82 kg/m   Physical Exam   General Appearance:    Alert, cooperative, no distress  Eyes:    PERRL, conjunctiva/corneas clear, EOM's intact       Lungs:     Clear to auscultation bilaterally, respirations unlabored  Heart:    Regular rate and rhythm  Neurologic:   Awake, alert, oriented x 3. No apparent focal neurological           defect.   MS:     Unusual, ill-defined swelling over right biceps, non-tender. +5/5 muscle strength.        Assessment & Plan:     1. Essential (primary) hypertension Well controlled.  Continue current medications.   - Renal function panel  2. HLD (hyperlipidemia) He is tolerating rosuvastatin well with no adverse effects.   - Lipid panel  3. Prostate cancer screening  - PSA  4. Swelling of arm No known injury, but was initially black and blue like a large bruise. Swelling suspected to be remnants of hematoma in which case he was advised it should resolve completely over the next 4-6 weeks. He is to call for orthopedics if not completely resolved within 6 weeks.   5. Allergic rhinitis Refill fluticasone nasal spray.    He refused flu vaccine.       Mila Merryonald Waylon Hershey, MD  [facility]

## 2016-07-19 ENCOUNTER — Other Ambulatory Visit: Payer: Self-pay | Admitting: Family Medicine

## 2016-07-26 ENCOUNTER — Other Ambulatory Visit: Payer: Self-pay | Admitting: Family Medicine

## 2016-07-26 ENCOUNTER — Other Ambulatory Visit: Payer: Self-pay | Admitting: *Deleted

## 2016-07-26 MED ORDER — LOSARTAN POTASSIUM 100 MG PO TABS: 100.0000 mg | ORAL_TABLET | Freq: Every day | ORAL | 3 refills | Status: DC

## 2016-07-26 MED ORDER — ROSUVASTATIN CALCIUM 5 MG PO TABS: 5.0000 mg | ORAL_TABLET | Freq: Every day | ORAL | 3 refills | Status: DC

## 2016-07-26 NOTE — Telephone Encounter (Signed)
Requesting 90 day supply [facility]

## 2016-09-01 LAB — RENAL FUNCTION PANEL
Albumin: 4.6 g/dL (ref 3.6–4.8)
BUN / CREAT RATIO: 15 (ref 10–24)
BUN: 18 mg/dL (ref 8–27)
CALCIUM: 9.5 mg/dL (ref 8.6–10.2)
CHLORIDE: 102 mmol/L (ref 96–106)
CO2: 24 mmol/L (ref 18–29)
Creatinine, Ser: 1.17 mg/dL (ref 0.76–1.27)
GFR calc non Af Amer: 67 mL/min/{1.73_m2} (ref >59)
GFR, EST AFRICAN AMERICAN: 78 mL/min/{1.73_m2} (ref >59)
GLUCOSE: 102 mg/dL — AB (ref 65–99)
POTASSIUM: 5.4 mmol/L — AB (ref 3.5–5.2)
Phosphorus: 3.6 mg/dL (ref 2.5–4.5)
Sodium: 143 mmol/L (ref 134–144)

## 2016-09-01 LAB — LIPID PANEL
CHOLESTEROL TOTAL: 161 mg/dL (ref 100–199)
Chol/HDL Ratio: 2.7 ratio units (ref 0.0–5.0)
HDL: 59 mg/dL (ref >39)
LDL Calculated: 82 mg/dL (ref 0–99)
Triglycerides: 100 mg/dL (ref 0–149)
VLDL CHOLESTEROL CAL: 20 mg/dL (ref 5–40)

## 2016-09-01 LAB — PSA: PROSTATE SPECIFIC AG, SERUM: 2.6 ng/mL (ref 0.0–4.0)

## 2016-09-03 ENCOUNTER — Telehealth: Payer: Self-pay

## 2016-09-03 NOTE — Telephone Encounter (Signed)
-----   Message from Malva Limesonald E Fisher, MD sent at 09/02/2016  4:44 PM EST ----- Labs are good. Continue current medications.  Check yearly.

## 2016-09-03 NOTE — Telephone Encounter (Signed)
Patient has been advised. KW 

## 2017-08-20 ENCOUNTER — Other Ambulatory Visit: Payer: Self-pay | Admitting: Family Medicine

## 2017-08-20 NOTE — Telephone Encounter (Signed)
Patient was notified. Appt scheduled for 08/26/2017 at 4:30 pm.

## 2017-08-20 NOTE — Telephone Encounter (Signed)
Please advise patient he has not been seen in over a year and needs o.v. This month. Have sent one refill to his mail order to get by until appointment.

## 2017-08-26 ENCOUNTER — Ambulatory Visit: Payer: 59 | Admitting: Family Medicine

## 2017-08-26 VITALS — BP 124/70 | HR 70 | Temp 98.0°F | Resp 16 | Ht 72.0 in | Wt 191.0 lb

## 2017-08-26 DIAGNOSIS — I10 Essential (primary) hypertension: Secondary | ICD-10-CM

## 2017-08-26 DIAGNOSIS — F419 Anxiety disorder, unspecified: Secondary | ICD-10-CM | POA: Diagnosis not present

## 2017-08-26 DIAGNOSIS — E785 Hyperlipidemia, unspecified: Secondary | ICD-10-CM | POA: Diagnosis not present

## 2017-08-26 NOTE — Patient Instructions (Signed)
   Start back on Crestor and go to lab for blood work in 3 weeks.   Please [facility] draw [facility] in [address] [facility] when you get your blood drawn

## 2017-08-26 NOTE — Progress Notes (Signed)
Patient: Kurt Edwards Male    DOB: 01/29/1956   61 y.o.   MRN: [id number] Visit Date: 08/26/2017  Today's Provider: Mila Merryonald Joneisha Miles, MD   Chief Complaint  Patient presents with  . Follow-up  . Diabetes   Subjective:    HPI   Hypertension, follow-up:  BP Readings from Last 3 Encounters:  08/26/17 124/70  06/26/16 120/82  11/03/14 122/76    He was last seen for hypertension 06/26/2016.  BP at that visit was 120/82. Management since that visit includes; no changes.He reports good compliance with treatment. He is not having side effects. none He is not exercising. He is not adherent to low salt diet.   Outside blood pressures are 150/88. He is experiencing none.  Patient denies none.   Cardiovascular risk factors include none.  Use of agents associated with hypertension: none.   ------------------------------------------------------------------------    Lipid/Cholesterol, Follow-up:   Last seen for this 06/26/2016.  Management since that visit includes; no changes.  Last Lipid Panel:    Component Value Date/Time   CHOL 161 08/31/2016 1041   TRIG 100 08/31/2016 1041   HDL 59 08/31/2016 1041   CHOLHDL 2.7 08/31/2016 1041   LDLCALC 82 08/31/2016 1041    He reports good compliance with treatment. He is not having side effects. none  Wt Readings from Last 3 Encounters:  08/26/17 191 lb (86.6 kg)  06/26/16 183 lb (83 kg)  11/03/14 179 lb (81.2 kg)    ------------------------------------------------------------------------    Allergies  Allergen Reactions  . Amlodipine Besylate Hives    Possible reaction to amlodipine  . Atorvastatin     Other reaction(s): Muscle Pain  . Losartan Swelling     Current Outpatient Medications:  .  amphetamine-dextroamphetamine (ADDERALL) 30 MG tablet, Take 30 mg by mouth daily., Disp: , Rfl:  .  DULoxetine (CYMBALTA) 20 MG capsule, Take 60 mg by mouth daily., Disp: , Rfl:  .  fluticasone (FLONASE) 50 MCG/ACT  nasal spray, Place 2 sprays into both nostrils daily., Disp: 42 g, Rfl: 6 .  losartan (COZAAR) 100 MG tablet, TAKE 1 TABLET DAILY, Disp: 90 tablet, Rfl: 3 .  rosuvastatin (CRESTOR) 5 MG tablet, Take 1 tablet (5 mg total) by mouth daily., Disp: 90 tablet, Rfl: 3 .  tadalafil (CIALIS) 20 MG tablet, Take by mouth., Disp: , Rfl:  .  lurasidone (LATUDA) 20 MG TABS tablet, Take 20 mg by mouth daily., Disp: , Rfl:  .  pantoprazole (PROTONIX) 40 MG tablet, Take 1 tablet (40 mg total) by mouth daily. (Patient taking differently: Take 40 mg by mouth as needed. ), Disp: 30 tablet, Rfl: 1  Review of Systems  Constitutional: Negative for appetite change, chills and fever.  Respiratory: Negative for chest tightness, shortness of breath and wheezing.   Cardiovascular: Negative for chest pain and palpitations.  Gastrointestinal: Negative for abdominal pain, nausea and vomiting.    Social History   Tobacco Use  . Smoking status: Never Smoker  . Smokeless tobacco: Never Used  Substance Use Topics  . Alcohol use: No   Objective:   BP 124/70 (BP Location: Left Arm, Patient Position: Sitting, Cuff Size: Large)   Pulse 70   Temp 98 F (36.7 C) (Oral)   Resp 16   Ht 6' (1.829 m)   Wt 191 lb (86.6 kg)   SpO2 98%   BMI 25.90 kg/m  Vitals:   08/26/17 1643  BP: 124/70  Pulse: 70  Resp: 16  Temp: 98 F (36.7 C)  TempSrc: Oral  SpO2: 98%  Weight: 191 lb (86.6 kg)  Height: 6' (1.829 m)   Depression screen [facility] 08/26/2017  Decreased Interest 1  Down, Depressed, Hopeless 1  PHQ - 2 Score 2  Altered sleeping 0  Tired, decreased energy 0  Change in appetite 0  Feeling bad or failure about yourself  0  Trouble concentrating 0  Moving slowly or fidgety/restless 0  Suicidal thoughts 0  PHQ-9 Score 2  Difficult doing work/chores Not difficult at all     Physical Exam   General Appearance:    Alert, cooperative, no distress  Eyes:    PERRL, conjunctiva/corneas clear, EOM's intact         Lungs:     Clear to auscultation bilaterally, respirations unlabored  Heart:    Regular rate and rhythm  Neurologic:   Awake, alert, oriented x 3. No apparent focal neurological           defect.           Assessment & Plan:     1. Essential (primary) hypertension Well controlled.  Continue current medications.   - Lipid panel - COMPLETE METABOLIC PANEL WITH GFR  2. Anxiety disorder, unspecified type Stable on current medication regiment.   3. Hyperlipidemia, unspecified hyperlipidemia type Has been off of rosuvastatin for few months, but states he has 1-2 months supply at home. He is going to start back on medication and have labs done after about 3 weeks.  - Lipid panel - COMPLETE METABOLIC PANEL WITH GFR       Mila Merryonald Emett Stapel, MD  [facility]

## 2017-09-01 ENCOUNTER — Encounter: Payer: Self-pay | Admitting: Family Medicine

## 2017-09-02 ENCOUNTER — Other Ambulatory Visit: Payer: Self-pay | Admitting: *Deleted

## 2017-09-02 MED ORDER — TADALAFIL 20 MG PO TABS: 20.0000 mg | ORAL_TABLET | Freq: Every day | ORAL | 5 refills | Status: DC | PRN

## 2017-10-11 ENCOUNTER — Encounter: Payer: Self-pay | Admitting: Family Medicine

## 2017-10-11 ENCOUNTER — Ambulatory Visit: Payer: 59 | Admitting: Family Medicine

## 2017-10-11 VITALS — BP 140/80 | HR 78 | Temp 98.5°F | Resp 16 | Wt 193.0 lb

## 2017-10-11 DIAGNOSIS — L03011 Cellulitis of right finger: Secondary | ICD-10-CM

## 2017-10-11 MED ORDER — AMOXICILLIN-POT CLAVULANATE 875-125 MG PO TABS: 1.0000 | ORAL_TABLET | Freq: Two times a day (BID) | ORAL | 0 refills | Status: AC

## 2017-10-11 NOTE — Progress Notes (Signed)
       Patient: Kurt Edwards Male    DOB: 02/12/1956   61 y.o.   MRN: [id number] Visit Date: 10/11/2017  Today's Provider: Mila Merryonald Levada Bowersox, MD   Chief Complaint  Patient presents with  . Wound Infection   Subjective:    Patient was cleaning bathtub drainfive days ago and cut his thumb on right hand on metal of shower drain. Patient stated that yesterday he noticed his thumb was swollen and had pus coming out of laceration. Thumb is swollen, painful and red.        Allergies  Allergen Reactions  . Amlodipine Besylate Hives    Possible reaction to amlodipine  . Atorvastatin     Other reaction(s): Muscle Pain  . Losartan Swelling     Current Outpatient Medications:  .  amphetamine-dextroamphetamine (ADDERALL) 30 MG tablet, Take 30 mg by mouth daily., Disp: , Rfl:  .  DULoxetine (CYMBALTA) 20 MG capsule, Take 60 mg by mouth daily., Disp: , Rfl:  .  fluticasone (FLONASE) 50 MCG/ACT nasal spray, Place 2 sprays into both nostrils daily., Disp: 42 g, Rfl: 6 .  losartan (COZAAR) 100 MG tablet, TAKE 1 TABLET DAILY, Disp: 90 tablet, Rfl: 3 .  lurasidone (LATUDA) 20 MG TABS tablet, Take 20 mg by mouth daily., Disp: , Rfl:  .  rosuvastatin (CRESTOR) 5 MG tablet, Take 1 tablet (5 mg total) by mouth daily., Disp: 90 tablet, Rfl: 3 .  tadalafil (CIALIS) 20 MG tablet, Take 1 tablet (20 mg total) daily as needed by mouth for erectile dysfunction., Disp: 10 tablet, Rfl: 5 .  pantoprazole (PROTONIX) 40 MG tablet, Take 1 tablet (40 mg total) by mouth daily. (Patient taking differently: Take 40 mg by mouth as needed. ), Disp: 30 tablet, Rfl: 1  Review of Systems  Constitutional: Negative for appetite change, chills and fever.  Respiratory: Negative for chest tightness, shortness of breath and wheezing.   Cardiovascular: Negative for chest pain and palpitations.  Gastrointestinal: Negative for abdominal pain, nausea and vomiting.    Social History   Tobacco Use  . Smoking status: Never  Smoker  . Smokeless tobacco: Never Used  Substance Use Topics  . Alcohol use: No   Objective:   BP 140/80 (BP Location: Left Arm, Patient Position: Sitting, Cuff Size: Large)   Pulse 78   Temp 98.5 F (36.9 C) (Oral)   Resp 16   Wt 193 lb (87.5 kg)   SpO2 99%   BMI 26.18 kg/m  Vitals:   10/11/17 1453  BP: 140/80  Pulse: 78  Resp: 16  Temp: 98.5 F (36.9 C)  TempSrc: Oral  SpO2: 99%  Weight: 193 lb (87.5 kg)     Physical Exam  About 1/2cm shallow laceration volar aspect right thumb with moderate redness and swelling of surrounding area. Scant yellow discharge. No erythema or streaks extending into hand.     Assessment & Plan:            Mila Merryonald Latia Mataya, MD  [facility]

## 2017-10-29 ENCOUNTER — Other Ambulatory Visit: Payer: Self-pay | Admitting: Family Medicine

## 2017-10-29 ENCOUNTER — Encounter: Payer: Self-pay | Admitting: Family Medicine

## 2017-10-30 ENCOUNTER — Other Ambulatory Visit: Payer: Self-pay | Admitting: *Deleted

## 2017-10-30 LAB — COMPREHENSIVE METABOLIC PANEL
ALK PHOS: 77 IU/L (ref 39–117)
ALT: 24 IU/L (ref 0–44)
AST: 20 IU/L (ref 0–40)
Albumin/Globulin Ratio: 1.7 (ref 1.2–2.2)
Albumin: 4.3 g/dL (ref 3.6–4.8)
BUN/Creatinine Ratio: 17 (ref 10–24)
BUN: 19 mg/dL (ref 8–27)
Bilirubin Total: 0.3 mg/dL (ref 0.0–1.2)
CO2: 24 mmol/L (ref 20–29)
CREATININE: 1.15 mg/dL (ref 0.76–1.27)
Calcium: 9.6 mg/dL (ref 8.6–10.2)
Chloride: 105 mmol/L (ref 96–106)
GFR calc Af Amer: 79 mL/min/{1.73_m2} (ref >59)
GFR calc non Af Amer: 68 mL/min/{1.73_m2} (ref >59)
GLOBULIN, TOTAL: 2.5 g/dL (ref 1.5–4.5)
GLUCOSE: 113 mg/dL — AB (ref 65–99)
Potassium: 5 mmol/L (ref 3.5–5.2)
SODIUM: 143 mmol/L (ref 134–144)
Total Protein: 6.8 g/dL (ref 6.0–8.5)

## 2017-10-30 LAB — LIPID PANEL W/O CHOL/HDL RATIO
CHOLESTEROL TOTAL: 154 mg/dL (ref 100–199)
HDL: 56 mg/dL (ref >39)
LDL CALC: 81 mg/dL (ref 0–99)
TRIGLYCERIDES: 83 mg/dL (ref 0–149)
VLDL Cholesterol Cal: 17 mg/dL (ref 5–40)

## 2017-10-30 NOTE — Telephone Encounter (Signed)
Patient had labs drawn today.

## 2017-10-31 MED ORDER — ROSUVASTATIN CALCIUM 5 MG PO TABS: 5.0000 mg | ORAL_TABLET | Freq: Every day | ORAL | 3 refills | Status: DC

## 2018-01-05 ENCOUNTER — Encounter: Payer: Self-pay | Admitting: Family Medicine

## 2018-01-07 ENCOUNTER — Telehealth: Payer: Self-pay | Admitting: Family Medicine

## 2018-01-07 NOTE — Telephone Encounter (Signed)
lmtcb to schedule

## 2018-01-07 NOTE — Telephone Encounter (Signed)
Please advise patient he needs to schedule office to evaluate issues in his e-mail.

## 2018-01-07 NOTE — Telephone Encounter (Signed)
Please advise patient he should schedule office visit to have these symptoms evaluated and check up on blood pressure.

## 2018-01-07 NOTE — Telephone Encounter (Signed)
Appointment has been made for tomorrow.

## 2018-01-08 ENCOUNTER — Encounter: Payer: Self-pay | Admitting: Family Medicine

## 2018-01-08 ENCOUNTER — Ambulatory Visit (INDEPENDENT_AMBULATORY_CARE_PROVIDER_SITE_OTHER): Payer: 59 | Admitting: Family Medicine

## 2018-01-08 VITALS — BP 150/84 | HR 84 | Temp 98.6°F | Resp 16 | Wt 190.0 lb

## 2018-01-08 DIAGNOSIS — I10 Essential (primary) hypertension: Secondary | ICD-10-CM

## 2018-01-08 DIAGNOSIS — R232 Flushing: Secondary | ICD-10-CM

## 2018-01-08 DIAGNOSIS — R739 Hyperglycemia, unspecified: Secondary | ICD-10-CM | POA: Diagnosis not present

## 2018-01-08 DIAGNOSIS — Z125 Encounter for screening for malignant neoplasm of prostate: Secondary | ICD-10-CM

## 2018-01-08 NOTE — Progress Notes (Signed)
Patient: Kurt Edwards Male    DOB: Apr 02, 1956   62 y.o.   MRN: [id number] Visit Date: 01/08/2018  Today's Provider: Mila Merry, MD   Chief Complaint  Patient presents with  . Blood Pressure Check   Subjective:    HPI  Hypertension:  BP Readings from Last 3 Encounters:  01/08/18 (!) 150/84  10/11/17 140/80  08/26/17 124/70    He was last seen for hypertension 3 months ago.  BP at that visit was 124/70. Management since that visit includes no changes. He reports good compliance with treatment. He is not having side effects.  He is not exercising. He is adherent to low salt diet.   Outside blood pressures are 160s/90 by wrist machine He is experiencing sweats, flushed feeling and feels like he can hear his heart beating.  Patient denies chest pain, chest pressure/discomfort, claudication, dyspnea, exertional chest pressure/discomfort, fatigue, irregular heart beat, lower extremity edema, near-syncope, orthopnea, palpitations, paroxysmal nocturnal dyspnea, syncope and tachypnea.   Cardiovascular risk factors include advanced age (older than 43 for men, 49 for women), hypertension and male gender.  Use of agents associated with hypertension: none.     Weight trend: fluctuating a bit Wt Readings from Last 3 Encounters:  01/08/18 190 lb (86.2 kg)  10/11/17 193 lb (87.5 kg)  08/26/17 191 lb (86.6 kg)    Current diet: in general, an "unhealthy" diet  Not taking any vitamin or supplements.  Is having to urinate more frequently, having to get up every night to void and sometimes losing control of bladder if he does not get up quickly to urinate. Has also been having episodes of flushing in his cheeks that last several minutes then spontaneously resolving.  ------------------------------------------------------------------------     Allergies  Allergen Reactions  . Amlodipine Besylate Hives    Possible reaction to amlodipine  . Atorvastatin     Other  reaction(s): Muscle Pain  . Losartan Swelling     Current Outpatient Medications:  .  amphetamine-dextroamphetamine (ADDERALL) 30 MG tablet, Take 30 mg by mouth daily., Disp: , Rfl:  .  DULoxetine (CYMBALTA) 20 MG capsule, Take 60 mg by mouth daily., Disp: , Rfl:  .  fluticasone (FLONASE) 50 MCG/ACT nasal spray, Place 2 sprays into both nostrils daily., Disp: 42 g, Rfl: 6 .  losartan (COZAAR) 100 MG tablet, TAKE 1 TABLET DAILY, Disp: 90 tablet, Rfl: 3 .  rosuvastatin (CRESTOR) 5 MG tablet, Take 1 tablet (5 mg total) by mouth daily., Disp: 90 tablet, Rfl: 3 .  tadalafil (CIALIS) 20 MG tablet, Take 1 tablet (20 mg total) daily as needed by mouth for erectile dysfunction., Disp: 10 tablet, Rfl: 5 .  lurasidone (LATUDA) 20 MG TABS tablet, Take 20 mg by mouth daily., Disp: , Rfl:   Review of Systems  Constitutional: Positive for diaphoresis. Negative for appetite change, chills and fever.  Respiratory: Negative for chest tightness, shortness of breath and wheezing.   Cardiovascular: Negative for chest pain and palpitations.  Gastrointestinal: Negative for abdominal pain, nausea and vomiting.    Social History   Tobacco Use  . Smoking status: Never Smoker  . Smokeless tobacco: Never Used  Substance Use Topics  . Alcohol use: No   Objective:   BP (!) 150/84 (BP Location: Right Arm)   Pulse 84   Temp 98.6 F (37 C) (Oral)   Resp 16   Wt 190 lb (86.2 kg)   SpO2 99% Comment: room air  BMI 25.77  kg/m  Vitals:   01/08/18 0852 01/08/18 0855  BP: (!) 158/90 (!) 150/84  Pulse: 84   Resp: 16   Temp: 98.6 F (37 C)   TempSrc: Oral   SpO2: 99%   Weight: 190 lb (86.2 kg)      Physical Exam   General Appearance:    Alert, cooperative, no distress  Eyes:    PERRL, conjunctiva/corneas clear, EOM's intact       Lungs:     Clear to auscultation bilaterally, respirations unlabored  Heart:    Regular rate and rhythm  Neurologic:   Awake, alert, oriented x 3. No apparent focal  neurological           defect.           Assessment & Plan:     1. Essential (primary) hypertension Uncontrolled. Discussed possible role of stimulant on BP and he may need to discuss with his psychiatrist. Will add betablocker or ACEI after reviewing lab results.   2. Hyperglycemia  - TSH - Hemoglobin A1c  3. Prostate cancer screening  - PSA  4. Flushing  - CBC - TSH       Mila Merryonald Minyon Billiter, MD  [facility]

## 2018-01-09 ENCOUNTER — Telehealth: Payer: Self-pay

## 2018-01-09 LAB — CBC
HEMATOCRIT: 44.4 % (ref 37.5–51.0)
Hemoglobin: 14.6 g/dL (ref 13.0–17.7)
MCH: 30.4 pg (ref 26.6–33.0)
MCHC: 32.9 g/dL (ref 31.5–35.7)
MCV: 92 fL (ref 79–97)
Platelets: 335 10*3/uL (ref 150–379)
RBC: 4.81 x10E6/uL (ref 4.14–5.80)
RDW: 13.6 % (ref 12.3–15.4)
WBC: 4.8 10*3/uL (ref 3.4–10.8)

## 2018-01-09 LAB — HEMOGLOBIN A1C
Est. average glucose Bld gHb Est-mCnc: 128 mg/dL
Hgb A1c MFr Bld: 6.1 % — ABNORMAL HIGH (ref 4.8–5.6)

## 2018-01-09 LAB — TSH: TSH: 1.37 u[IU]/mL (ref 0.450–4.500)

## 2018-01-09 LAB — PSA: PROSTATE SPECIFIC AG, SERUM: 3.8 ng/mL (ref 0.0–4.0)

## 2018-01-09 MED ORDER — LISINOPRIL 10 MG PO TABS: 10.0000 mg | ORAL_TABLET | Freq: Every day | ORAL | 1 refills | Status: DC

## 2018-01-09 MED ORDER — TAMSULOSIN HCL 0.4 MG PO CAPS: 0.4000 mg | ORAL_CAPSULE | Freq: Every day | ORAL | 1 refills | Status: DC

## 2018-01-09 NOTE — Telephone Encounter (Signed)
Advised patient as below. Medication was sent into the pharmacy. Patient will call back to schedule 1 month appt.

## 2018-01-09 NOTE — Telephone Encounter (Signed)
-----   Message from Malva Limesonald E Fisher, MD sent at 01/09/2018 11:12 AM EDT ----- Average blood sugar is 128 which is borderline for diabetes. Need to cut back on sweets PSA is borderline high, probably has enlarged prostate, need to start tamsulosin 0.4mg  once a day, #30, rf x 1 For blood pressure need to start lisinopril 10mg  once a day, #30, rf x 1.  Schedule follow up in 1 month.

## 2018-02-05 ENCOUNTER — Encounter: Payer: Self-pay | Admitting: Family Medicine

## 2018-08-15 ENCOUNTER — Other Ambulatory Visit: Payer: Self-pay | Admitting: Family Medicine

## 2018-10-11 ENCOUNTER — Other Ambulatory Visit: Payer: Self-pay | Admitting: Family Medicine

## 2018-10-21 DIAGNOSIS — F411 Generalized anxiety disorder: Secondary | ICD-10-CM | POA: Diagnosis not present

## 2018-10-23 DIAGNOSIS — F411 Generalized anxiety disorder: Secondary | ICD-10-CM | POA: Diagnosis not present

## 2018-10-23 DIAGNOSIS — F331 Major depressive disorder, recurrent, moderate: Secondary | ICD-10-CM | POA: Diagnosis not present

## 2018-10-23 DIAGNOSIS — F5105 Insomnia due to other mental disorder: Secondary | ICD-10-CM | POA: Diagnosis not present

## 2018-10-28 DIAGNOSIS — F411 Generalized anxiety disorder: Secondary | ICD-10-CM | POA: Diagnosis not present

## 2018-11-04 DIAGNOSIS — F411 Generalized anxiety disorder: Secondary | ICD-10-CM | POA: Diagnosis not present

## 2018-11-04 DIAGNOSIS — F331 Major depressive disorder, recurrent, moderate: Secondary | ICD-10-CM | POA: Diagnosis not present

## 2018-11-10 DIAGNOSIS — F411 Generalized anxiety disorder: Secondary | ICD-10-CM | POA: Diagnosis not present

## 2018-11-25 DIAGNOSIS — F331 Major depressive disorder, recurrent, moderate: Secondary | ICD-10-CM | POA: Diagnosis not present

## 2018-11-28 DIAGNOSIS — F331 Major depressive disorder, recurrent, moderate: Secondary | ICD-10-CM | POA: Diagnosis not present

## 2018-11-28 DIAGNOSIS — F411 Generalized anxiety disorder: Secondary | ICD-10-CM | POA: Diagnosis not present

## 2018-11-28 DIAGNOSIS — F5105 Insomnia due to other mental disorder: Secondary | ICD-10-CM | POA: Diagnosis not present

## 2018-12-11 DIAGNOSIS — F411 Generalized anxiety disorder: Secondary | ICD-10-CM | POA: Diagnosis not present

## 2018-12-11 DIAGNOSIS — F5105 Insomnia due to other mental disorder: Secondary | ICD-10-CM | POA: Diagnosis not present

## 2018-12-11 DIAGNOSIS — F331 Major depressive disorder, recurrent, moderate: Secondary | ICD-10-CM | POA: Diagnosis not present

## 2018-12-18 DIAGNOSIS — F331 Major depressive disorder, recurrent, moderate: Secondary | ICD-10-CM | POA: Diagnosis not present

## 2018-12-30 DIAGNOSIS — F331 Major depressive disorder, recurrent, moderate: Secondary | ICD-10-CM | POA: Diagnosis not present

## 2019-01-01 DIAGNOSIS — F411 Generalized anxiety disorder: Secondary | ICD-10-CM | POA: Diagnosis not present

## 2019-01-01 DIAGNOSIS — F5105 Insomnia due to other mental disorder: Secondary | ICD-10-CM | POA: Diagnosis not present

## 2019-01-01 DIAGNOSIS — F331 Major depressive disorder, recurrent, moderate: Secondary | ICD-10-CM | POA: Diagnosis not present

## 2019-01-04 ENCOUNTER — Encounter: Payer: Self-pay | Admitting: Family Medicine

## 2019-01-05 MED ORDER — LOSARTAN POTASSIUM 100 MG PO TABS: 100.0000 mg | ORAL_TABLET | Freq: Every day | ORAL | 0 refills | Status: DC

## 2019-01-05 MED ORDER — ROSUVASTATIN CALCIUM 5 MG PO TABS: 5.0000 mg | ORAL_TABLET | Freq: Every day | ORAL | 0 refills | Status: DC

## 2019-03-10 DIAGNOSIS — F331 Major depressive disorder, recurrent, moderate: Secondary | ICD-10-CM | POA: Diagnosis not present

## 2019-03-18 DIAGNOSIS — F5105 Insomnia due to other mental disorder: Secondary | ICD-10-CM | POA: Diagnosis not present

## 2019-03-18 DIAGNOSIS — F331 Major depressive disorder, recurrent, moderate: Secondary | ICD-10-CM | POA: Diagnosis not present

## 2019-03-18 DIAGNOSIS — F411 Generalized anxiety disorder: Secondary | ICD-10-CM | POA: Diagnosis not present

## 2019-03-24 DIAGNOSIS — F331 Major depressive disorder, recurrent, moderate: Secondary | ICD-10-CM | POA: Diagnosis not present

## 2019-03-29 ENCOUNTER — Other Ambulatory Visit: Payer: Self-pay | Admitting: Family Medicine

## 2019-04-07 DIAGNOSIS — F331 Major depressive disorder, recurrent, moderate: Secondary | ICD-10-CM | POA: Diagnosis not present

## 2019-04-21 DIAGNOSIS — F331 Major depressive disorder, recurrent, moderate: Secondary | ICD-10-CM | POA: Diagnosis not present

## 2019-05-05 DIAGNOSIS — F331 Major depressive disorder, recurrent, moderate: Secondary | ICD-10-CM | POA: Diagnosis not present

## 2019-05-19 DIAGNOSIS — F331 Major depressive disorder, recurrent, moderate: Secondary | ICD-10-CM | POA: Diagnosis not present

## 2019-06-02 DIAGNOSIS — F331 Major depressive disorder, recurrent, moderate: Secondary | ICD-10-CM | POA: Diagnosis not present

## 2019-06-10 DIAGNOSIS — F411 Generalized anxiety disorder: Secondary | ICD-10-CM | POA: Diagnosis not present

## 2019-06-10 DIAGNOSIS — F5105 Insomnia due to other mental disorder: Secondary | ICD-10-CM | POA: Diagnosis not present

## 2019-06-10 DIAGNOSIS — F331 Major depressive disorder, recurrent, moderate: Secondary | ICD-10-CM | POA: Diagnosis not present

## 2019-06-16 DIAGNOSIS — F331 Major depressive disorder, recurrent, moderate: Secondary | ICD-10-CM | POA: Diagnosis not present

## 2019-06-21 ENCOUNTER — Other Ambulatory Visit: Payer: Self-pay | Admitting: Family Medicine

## 2019-06-23 DIAGNOSIS — F331 Major depressive disorder, recurrent, moderate: Secondary | ICD-10-CM | POA: Diagnosis not present

## 2019-06-23 DIAGNOSIS — F5105 Insomnia due to other mental disorder: Secondary | ICD-10-CM | POA: Diagnosis not present

## 2019-06-23 DIAGNOSIS — F411 Generalized anxiety disorder: Secondary | ICD-10-CM | POA: Diagnosis not present

## 2019-06-30 DIAGNOSIS — F331 Major depressive disorder, recurrent, moderate: Secondary | ICD-10-CM | POA: Diagnosis not present

## 2019-07-09 DIAGNOSIS — F331 Major depressive disorder, recurrent, moderate: Secondary | ICD-10-CM | POA: Diagnosis not present

## 2019-07-09 DIAGNOSIS — F411 Generalized anxiety disorder: Secondary | ICD-10-CM | POA: Diagnosis not present

## 2019-07-09 LAB — LIPID PANEL
Cholesterol: 178 (ref 0–200)
HDL: 55 (ref 35–70)
LDL Cholesterol: 99
Triglycerides: 134 (ref 40–160)

## 2019-07-16 DIAGNOSIS — F411 Generalized anxiety disorder: Secondary | ICD-10-CM | POA: Diagnosis not present

## 2019-07-16 DIAGNOSIS — F331 Major depressive disorder, recurrent, moderate: Secondary | ICD-10-CM | POA: Diagnosis not present

## 2019-07-16 DIAGNOSIS — F5105 Insomnia due to other mental disorder: Secondary | ICD-10-CM | POA: Diagnosis not present

## 2019-07-18 ENCOUNTER — Other Ambulatory Visit: Payer: Self-pay | Admitting: Family Medicine

## 2019-07-28 DIAGNOSIS — F331 Major depressive disorder, recurrent, moderate: Secondary | ICD-10-CM | POA: Diagnosis not present

## 2019-08-11 DIAGNOSIS — F331 Major depressive disorder, recurrent, moderate: Secondary | ICD-10-CM | POA: Diagnosis not present

## 2019-08-14 DIAGNOSIS — F5105 Insomnia due to other mental disorder: Secondary | ICD-10-CM | POA: Diagnosis not present

## 2019-08-14 DIAGNOSIS — F331 Major depressive disorder, recurrent, moderate: Secondary | ICD-10-CM | POA: Diagnosis not present

## 2019-08-14 DIAGNOSIS — F411 Generalized anxiety disorder: Secondary | ICD-10-CM | POA: Diagnosis not present

## 2019-08-25 DIAGNOSIS — F331 Major depressive disorder, recurrent, moderate: Secondary | ICD-10-CM | POA: Diagnosis not present

## 2019-09-07 DIAGNOSIS — F5105 Insomnia due to other mental disorder: Secondary | ICD-10-CM | POA: Diagnosis not present

## 2019-09-07 DIAGNOSIS — F331 Major depressive disorder, recurrent, moderate: Secondary | ICD-10-CM | POA: Diagnosis not present

## 2019-09-07 DIAGNOSIS — F411 Generalized anxiety disorder: Secondary | ICD-10-CM | POA: Diagnosis not present

## 2019-09-08 DIAGNOSIS — F331 Major depressive disorder, recurrent, moderate: Secondary | ICD-10-CM | POA: Diagnosis not present

## 2019-09-22 DIAGNOSIS — F331 Major depressive disorder, recurrent, moderate: Secondary | ICD-10-CM | POA: Diagnosis not present

## 2019-10-03 ENCOUNTER — Other Ambulatory Visit: Payer: Self-pay | Admitting: Family Medicine

## 2019-10-04 NOTE — Telephone Encounter (Signed)
Requested medication (s) are due for refill today: yes  Requested medication (s) are on the active medication list: yes  Last refill:  08/20/2019  Future visit scheduled: no  Notes to clinic:  LOV-01/08/2018 Overdue for office visit    Requested Prescriptions  Pending Prescriptions Disp Refills   rosuvastatin (CRESTOR) 5 MG tablet [Pharmacy Med Name: ROSUVASTATIN CALCIUM 5 MG TAB] 30 tablet 1    Sig: TAKE 1 TABLET BY MOUTH EVERY DAY      Cardiovascular:  Antilipid - Statins Failed - 10/03/2019 10:41 AM      Failed - Total Cholesterol in normal range and within 360 days    Cholesterol, Total  Date Value Ref Range Status  10/29/2017 154 100 - 199 mg/dL Final          Failed - LDL in normal range and within 360 days    LDL Calculated  Date Value Ref Range Status  10/29/2017 81 0 - 99 mg/dL Final          Failed - HDL in normal range and within 360 days    HDL  Date Value Ref Range Status  10/29/2017 56 >39 mg/dL Final          Failed - Triglycerides in normal range and within 360 days    Triglycerides  Date Value Ref Range Status  10/29/2017 83 0 - 149 mg/dL Final          Failed - Valid encounter within last 12 months    Recent Outpatient Visits           1 year ago Essential (primary) hypertension   [facility] Malva Limes, MD   1 year ago Cellulitis of right thumb   [facility] Malva Limes, MD   2 years ago Essential (primary) hypertension   [facility], MD   3 years ago Essential (primary) hypertension   [facility], Demetrios Isaacs, MD              Passed - Patient is not pregnant        losartan (COZAAR) 100 MG tablet [Pharmacy Med Name: LOSARTAN POTASSIUM 100 MG TAB] 30 tablet 1    Sig: TAKE 1 TABLET BY MOUTH EVERY DAY      Cardiovascular:  Angiotensin Receptor Blockers Failed - 10/03/2019 10:41 AM      Failed - Cr in normal range and within 180 days   Creatinine, Ser  Date Value Ref Range Status  10/29/2017 1.15 0.76 - 1.27 mg/dL Final          Failed - K in normal range and within 180 days    Potassium  Date Value Ref Range Status  10/29/2017 5.0 3.5 - 5.2 mmol/L Final          Failed - Last BP in normal range    BP Readings from Last 1 Encounters:  01/08/18 (!) 150/84          Failed - Valid encounter within last 6 months    Recent Outpatient Visits           1 year ago Essential (primary) hypertension   [facility] Malva Limes, MD   1 year ago Cellulitis of right thumb   [facility] Malva Limes, MD   2 years ago Essential (primary) hypertension   [facility] Malva Limes, MD   3 years ago Essential (primary) hypertension     [facility], Kirstie Peri, MD              Passed - Patient is not pregnant

## 2019-10-05 NOTE — Telephone Encounter (Signed)
Patient has not been seen in over a year. Have sent 90 supply, but he needs to make appt to be seen before the end of March.

## 2019-10-06 DIAGNOSIS — F331 Major depressive disorder, recurrent, moderate: Secondary | ICD-10-CM | POA: Diagnosis not present

## 2019-10-20 ENCOUNTER — Other Ambulatory Visit: Payer: Self-pay | Admitting: Family Medicine

## 2019-10-20 DIAGNOSIS — F331 Major depressive disorder, recurrent, moderate: Secondary | ICD-10-CM | POA: Diagnosis not present

## 2019-10-26 DIAGNOSIS — F411 Generalized anxiety disorder: Secondary | ICD-10-CM | POA: Diagnosis not present

## 2019-10-26 DIAGNOSIS — F5105 Insomnia due to other mental disorder: Secondary | ICD-10-CM | POA: Diagnosis not present

## 2019-10-26 DIAGNOSIS — F331 Major depressive disorder, recurrent, moderate: Secondary | ICD-10-CM | POA: Diagnosis not present

## 2019-11-03 DIAGNOSIS — F331 Major depressive disorder, recurrent, moderate: Secondary | ICD-10-CM | POA: Diagnosis not present

## 2019-11-24 DIAGNOSIS — F331 Major depressive disorder, recurrent, moderate: Secondary | ICD-10-CM | POA: Diagnosis not present

## 2019-12-01 DIAGNOSIS — F331 Major depressive disorder, recurrent, moderate: Secondary | ICD-10-CM | POA: Diagnosis not present

## 2019-12-15 DIAGNOSIS — F331 Major depressive disorder, recurrent, moderate: Secondary | ICD-10-CM | POA: Diagnosis not present

## 2019-12-21 DIAGNOSIS — F331 Major depressive disorder, recurrent, moderate: Secondary | ICD-10-CM | POA: Diagnosis not present

## 2019-12-21 DIAGNOSIS — F411 Generalized anxiety disorder: Secondary | ICD-10-CM | POA: Diagnosis not present

## 2019-12-21 DIAGNOSIS — F5105 Insomnia due to other mental disorder: Secondary | ICD-10-CM | POA: Diagnosis not present

## 2019-12-23 ENCOUNTER — Encounter: Payer: Self-pay | Admitting: Family Medicine

## 2019-12-24 ENCOUNTER — Other Ambulatory Visit: Payer: Self-pay

## 2019-12-24 NOTE — Telephone Encounter (Signed)
LMTCB, Patient has not had a OV or labs in 2 years. He will need a OV for refill. Okay for P[facility] to schedule appointment.

## 2019-12-24 NOTE — Telephone Encounter (Signed)
Left VM for patient to call for appointment before any refills can be given. Routing back to pcp.

## 2019-12-25 MED ORDER — LOSARTAN POTASSIUM 100 MG PO TABS: 100.0000 mg | ORAL_TABLET | Freq: Every day | ORAL | 0 refills | Status: DC

## 2019-12-29 DIAGNOSIS — F331 Major depressive disorder, recurrent, moderate: Secondary | ICD-10-CM | POA: Diagnosis not present

## 2020-01-12 DIAGNOSIS — F331 Major depressive disorder, recurrent, moderate: Secondary | ICD-10-CM | POA: Diagnosis not present

## 2020-01-14 NOTE — Progress Notes (Deleted)
    Established patient visit   {  This note template is in development as part of the [facility] [employer] project.   This template contains optional SmartLists. If no selections are made from an optional SmartList, it will be automatically removed when the note is signed.  Left clicking SmartLists that are in blue, underlined text will show additional information regarding the patient's history unless you are already editing the note in a pop-up window.  This text will be automatically removed from this note when it is signed.:1}   Patient: Kurt Edwards   DOB: 1955-12-13   64 y.o. Male  MRN: [id number] Visit Date: 01/14/2020  Today's healthcare provider: Mila Merry, MD  Subjective:   No chief complaint on file.  HPI  Hypertension:     BP Readings from Last 3 Encounters:  01/08/18 (!) 150/84  10/11/17 140/80  08/26/17 124/70    He was last seen for hypertension on 01/08/2018 BP at that visit was 150/84. Management since that visit includes added Lisinopril 10 mg switched to Losartan 100 mg due to side effects. He reports good compliance with treatment. He is not having side effects.  He is not exercising. He is adherent to low salt diet.   Outside blood pressures are  He is experiencing none Patient denies chest pain, chest pressure/discomfort, claudication, dyspnea, exertional chest pressure/discomfort, fatigue, irregular heart beat, lower extremity edema, near-syncope, orthopnea, palpitations, paroxysmal nocturnal dyspnea, syncope and tachypnea.   Cardiovascular risk factors include advanced age (older than 78 for men, 1 for women), hypertension and male gender.  Use of agents associated with hypertension: none.                Weight trend:     Wt Readings from Last 3 Encounters:  01/08/18 190 lb (86.2 kg)  10/11/17 193 lb (87.5 kg)  08/26/17 191 lb (86.6 kg)    Current diet: in general, an "unhealthy" diet   {Show patient history  (optional):23778::" "}   Medications: Outpatient Medications Prior to Visit  Medication Sig  . fluticasone (FLONASE) 50 MCG/ACT nasal spray Place 2 sprays into both nostrils daily.  Marland Kitchen losartan (COZAAR) 100 MG tablet Take 1 tablet (100 mg total) by mouth daily.  . rosuvastatin (CRESTOR) 5 MG tablet TAKE 1 TABLET BY MOUTH EVERY DAY  . tadalafil (ADCIRCA/CIALIS) 20 MG tablet TAKE 1 TABLET DAILY AS NEEDED FOR ERECTILE DYSFUNCTION  . [DISCONTINUED] amphetamine-dextroamphetamine (ADDERALL) 30 MG tablet Take 30 mg by mouth daily.  . [DISCONTINUED] DULoxetine (CYMBALTA) 20 MG capsule Take 60 mg by mouth daily.  . [DISCONTINUED] tamsulosin (FLOMAX) 0.4 MG CAPS capsule Take 1 capsule (0.4 mg total) by mouth daily.   No facility-administered medications prior to visit.    Review of Systems  Constitutional: Negative.   Respiratory: Negative.   Cardiovascular: Negative.   Musculoskeletal: Negative.     {Show previous labs (optional):23779::" "}    Objective:    There were no vitals taken for this visit. {Show previous vital signs (optional):23777::" "}  Physical Exam  ***  No results found for any visits on 01/15/20.    Assessment & Plan:    ***     Mila Merry, MD  [facility] [phone] (phone) [phone] (fax)  [facility]

## 2020-01-15 ENCOUNTER — Encounter: Payer: Self-pay | Admitting: Family Medicine

## 2020-01-15 ENCOUNTER — Telehealth: Payer: 59 | Admitting: Family Medicine

## 2020-01-25 DIAGNOSIS — F411 Generalized anxiety disorder: Secondary | ICD-10-CM | POA: Diagnosis not present

## 2020-01-25 DIAGNOSIS — F331 Major depressive disorder, recurrent, moderate: Secondary | ICD-10-CM | POA: Diagnosis not present

## 2020-01-26 DIAGNOSIS — F331 Major depressive disorder, recurrent, moderate: Secondary | ICD-10-CM | POA: Diagnosis not present

## 2020-02-08 DIAGNOSIS — F331 Major depressive disorder, recurrent, moderate: Secondary | ICD-10-CM | POA: Diagnosis not present

## 2020-02-08 DIAGNOSIS — F411 Generalized anxiety disorder: Secondary | ICD-10-CM | POA: Diagnosis not present

## 2020-02-09 DIAGNOSIS — F331 Major depressive disorder, recurrent, moderate: Secondary | ICD-10-CM | POA: Diagnosis not present

## 2020-02-19 NOTE — Progress Notes (Signed)
Established patient visit   Patient: Kurt Edwards   DOB: 1955/12/06   64 y.o. Male  MRN: [id number] Visit Date: 02/22/2020  Today's healthcare provider: Lelon Huh, MD   Chief Complaint  Patient presents with  . Hypertension  . Hyperlipidemia  . Hyperglycemia  . Elevated PSA   Subjective    HPI Hypertension, follow-up  BP Readings from Last 3 Encounters:  02/22/20 124/81  01/08/18 (!) 150/84  10/11/17 140/80   Wt Readings from Last 3 Encounters:  02/22/20 191 lb (86.6 kg)  01/08/18 190 lb (86.2 kg)  10/11/17 193 lb (87.5 kg)     He was last seen for hypertension 2 years ago.  BP at that visit was 150/84. Management since that visit includes starting lisinopril 10mg  once a day. Patient was later changed to Losartan due to Lisinopril causing dizziness.  He reports good compliance with treatment. He is not having side effects.  He is following a Regular diet. He is not exercising. He does not smoke.  Use of agents associated with hypertension: none.   Outside blood pressures are checked occasionally. Symptoms: No chest pain No chest pressure No palpitations No dyspnea No orthopnea No paroxysmal nocturnal dyspnea No lower extremity edema No syncope   Pertinent labs: Lab Results  Component Value Date   CHOL 178 07/09/2019   HDL 55 07/09/2019   LDLCALC 99 07/09/2019   TRIG 134 07/09/2019   CHOLHDL 2.7 08/31/2016   Lab Results  Component Value Date   NA 143 10/29/2017   K 5.0 10/29/2017   CO2 24 10/29/2017   GLUCOSE 113 (H) 10/29/2017   BUN 19 10/29/2017   CREATININE 1.15 10/29/2017   CALCIUM 9.6 10/29/2017   GFRNONAA 68 10/29/2017   GFRAA 79 10/29/2017     The 10-year ASCVD risk score Mikey Bussing DC Jr., et al., 2013) is: 10.4%* (Cholesterol units were assumed)   --------------------------------------------------------------------------------------------------- Lipid/Cholesterol, Follow-up  Last lipid panel Other pertinent labs  Lab  Results  Component Value Date   CHOL 178 07/09/2019   HDL 55 07/09/2019   LDLCALC 99 07/09/2019   TRIG 134 07/09/2019   CHOLHDL 2.7 08/31/2016   Lab Results  Component Value Date   ALT 24 10/29/2017   AST 20 10/29/2017   PLT 335 01/08/2018   TSH 1.370 01/08/2018     He was last seen for this 2 years ago.  Management since that visit includes no changes.  He reports good compliance with treatment. He is not having side effects.  Symptoms: No chest pain No chest pressure/discomfort No dyspnea No lower extremity edema No numbness or tingling of extremity No orthopnea No palpitations No paroxysmal nocturnal dyspnea No speech difficulty No syncope  Current diet: in general, an "unhealthy" diet Current exercise: none  Wt Readings from Last 3 Encounters:  02/22/20 191 lb (86.6 kg)  01/08/18 190 lb (86.2 kg)  10/11/17 193 lb (87.5 kg)   The 10-year ASCVD risk score Mikey Bussing DC Jr., et al., 2013) is: 10.4%* (Cholesterol units were assumed)  -----------------------------------------------------------------------------------------  Prediabetes, Follow-up  Lab Results  Component Value Date   HGBA1C 6.1 (H) 01/08/2018   GLUCOSE 113 (H) 10/29/2017   GLUCOSE 102 (H) 08/31/2016   GLUCOSE 121 (H) 12/20/2015    Last seen for for this 2 years ago.  Management since that visit includes recommend cutting back on sweets. Current symptoms include none and have been stable.  Prior visit with dietician: no Current diet: in general, an "unhealthy" diet  Current exercise: none  Pertinent Labs:    Component Value Date/Time   CHOL 178 07/09/2019 0000   CHOL 154 10/29/2017 0814   TRIG 134 07/09/2019 0000   CHOLHDL 2.7 08/31/2016 1041   CREATININE 1.15 10/29/2017 0814    Wt Readings from Last 3 Encounters:  02/22/20 191 lb (86.6 kg)  01/08/18 190 lb (86.2 kg)  10/11/17 193 lb (87.5 kg)     -----------------------------------------------------------------------------------------  Follow up for Elevated PSA:  The patient was last seen for this 2 years ago. Changes made at last visit include starting tamsulosin 0.4mg  once a day.   Lab Results  Component Value Date   PSA1 3.8 01/08/2018   PSA1 2.6 08/31/2016     He reports poor compliance with treatment. Patient stopped taking Tamsulosin because he wasn't sure if it was causing him to feel dizzy.  He feels that condition is Unchanged. He is not having side effects.   -----------------------------------------------------------------------------------------  He also continues to see Dr. Maryruth Bun for anxiety disorder and reports he is doing well on current medications regiement    Medications: Outpatient Medications Prior to Visit  Medication Sig  . brexpiprazole (REXULTI) 1 MG TABS tablet Take by mouth daily.  Marland Kitchen buPROPion (WELLBUTRIN XL) 150 MG 24 hr tablet Take 150 mg by mouth every morning.  . clonazePAM (KLONOPIN) 0.5 MG tablet TAKE 1 DAILY AS NEEDED  . losartan (COZAAR) 100 MG tablet Take 1 tablet (100 mg total) by mouth daily.  . rosuvastatin (CRESTOR) 5 MG tablet TAKE 1 TABLET BY MOUTH EVERY DAY  . sertraline (ZOLOFT) 100 MG tablet Take 100 mg by mouth daily.   Marland Kitchen venlafaxine XR (EFFEXOR-XR) 150 MG 24 hr capsule Take 150 mg by mouth daily.  . fluticasone (FLONASE) 50 MCG/ACT nasal spray Place 2 sprays into both nostrils daily.  . tadalafil (ADCIRCA/CIALIS) 20 MG tablet TAKE 1 TABLET DAILY AS NEEDED FOR ERECTILE DYSFUNCTION  . venlafaxine XR (EFFEXOR-XR) 75 MG 24 hr capsule Take 75 mg by mouth daily.   . [DISCONTINUED] REXULTI 1 MG TABS tablet Take 1 mg by mouth daily.   No facility-administered medications prior to visit.    Review of Systems  Constitutional: Negative for appetite change, chills and fever.  Respiratory: Negative for chest tightness, shortness of breath and wheezing.   Cardiovascular:  Negative for chest pain and palpitations.  Gastrointestinal: Negative for abdominal pain, nausea and vomiting.      Objective    BP 124/81 (BP Location: Left Arm, Patient Position: Sitting, Cuff Size: Large)   Pulse 71   Temp (!) 97.1 F (36.2 C) (Temporal)   Ht 5' 11.75" (1.822 m)   Wt 191 lb (86.6 kg)   SpO2 97% Comment: room air  BMI 26.09 kg/m    Physical Exam   General: Appearance:     Well developed, well nourished male in no acute distress  Eyes:    PERRL, conjunctiva/corneas clear, EOM's intact       Lungs:     Clear to auscultation bilaterally, respirations unlabored  Heart:    Normal heart rate. Normal rhythm. No murmurs, rubs, or gallops.   MS:   All extremities are intact.   Neurologic:   Awake, alert, oriented x 3. No apparent focal neurological           defect.         No results found for any visits on 02/22/20.  Assessment & Plan     1. Essential (primary) hypertension Well controlled.  Continue current medications.   - Renal function panel - CBC  2. Prediabetes  - HgB A1c  3. Need for hepatitis C screening test  - Hepatitis C antibody  4. Prostate cancer screening  - PSA Total (Reflex To Free) ([facility] only)  5. Seasonal allergic rhinitis due to pollen refill- fluticasone (FLONASE) 50 MCG/ACT nasal spray; Place 2 sprays into both nostrils daily.  Dispense: 42 g; Refill: 6  6. Erectile dysfunction, unspecified erectile dysfunction type refill- tadalafil (CIALIS) 20 MG tablet; TAKE 1 TABLET DAILY AS NEEDED  Dispense: 10 tablet; Refill: 6   No follow-ups on file.      The entirety of the information documented in the History of Present Illness, Review of Systems and Physical Exam were personally obtained by me. Portions of this information were initially documented by the CMA and reviewed by me for thoroughness and accuracy.      Mila Merry, MD  [facility] [phone] (phone) [phone] (fax)  [facility]

## 2020-02-22 ENCOUNTER — Other Ambulatory Visit: Payer: Self-pay

## 2020-02-22 ENCOUNTER — Ambulatory Visit: Payer: Federal, State, Local not specified - PPO | Admitting: Family Medicine

## 2020-02-22 ENCOUNTER — Encounter: Payer: Self-pay | Admitting: Family Medicine

## 2020-02-22 VITALS — BP 124/81 | HR 71 | Temp 97.1°F | Ht 71.75 in | Wt 191.0 lb

## 2020-02-22 DIAGNOSIS — N529 Male erectile dysfunction, unspecified: Secondary | ICD-10-CM

## 2020-02-22 DIAGNOSIS — I10 Essential (primary) hypertension: Secondary | ICD-10-CM | POA: Diagnosis not present

## 2020-02-22 DIAGNOSIS — R972 Elevated prostate specific antigen [PSA]: Secondary | ICD-10-CM

## 2020-02-22 DIAGNOSIS — R7303 Prediabetes: Secondary | ICD-10-CM

## 2020-02-22 DIAGNOSIS — J301 Allergic rhinitis due to pollen: Secondary | ICD-10-CM

## 2020-02-22 DIAGNOSIS — Z1159 Encounter for screening for other viral diseases: Secondary | ICD-10-CM | POA: Diagnosis not present

## 2020-02-22 DIAGNOSIS — Z125 Encounter for screening for malignant neoplasm of prostate: Secondary | ICD-10-CM

## 2020-02-22 MED ORDER — TADALAFIL 20 MG PO TABS: ORAL_TABLET | ORAL | 6 refills | Status: DC

## 2020-02-22 MED ORDER — FLUTICASONE PROPIONATE 50 MCG/ACT NA SUSP: 2.0000 | Freq: Every day | NASAL | 6 refills | Status: DC

## 2020-02-23 DIAGNOSIS — F331 Major depressive disorder, recurrent, moderate: Secondary | ICD-10-CM | POA: Diagnosis not present

## 2020-02-23 LAB — RENAL FUNCTION PANEL
Albumin: 4.5 g/dL (ref 3.8–4.8)
BUN/Creatinine Ratio: 13 (ref 10–24)
BUN: 19 mg/dL (ref 8–27)
CO2: 24 mmol/L (ref 20–29)
Calcium: 9.5 mg/dL (ref 8.6–10.2)
Chloride: 104 mmol/L (ref 96–106)
Creatinine, Ser: 1.45 mg/dL — ABNORMAL HIGH (ref 0.76–1.27)
GFR calc Af Amer: 59 mL/min/{1.73_m2} — ABNORMAL LOW (ref >59)
GFR calc non Af Amer: 51 mL/min/{1.73_m2} — ABNORMAL LOW (ref >59)
Glucose: 99 mg/dL (ref 65–99)
Phosphorus: 3 mg/dL (ref 2.8–4.1)
Potassium: 4.7 mmol/L (ref 3.5–5.2)
Sodium: 140 mmol/L (ref 134–144)

## 2020-02-23 LAB — HEMOGLOBIN A1C
Est. average glucose Bld gHb Est-mCnc: 123 mg/dL
Hgb A1c MFr Bld: 5.9 % — ABNORMAL HIGH (ref 4.8–5.6)

## 2020-02-23 LAB — PSA TOTAL (REFLEX TO FREE): Prostate Specific Ag, Serum: 4.6 ng/mL — ABNORMAL HIGH (ref 0.0–4.0)

## 2020-02-23 LAB — CBC
Hematocrit: 44.8 % (ref 37.5–51.0)
Hemoglobin: 14.8 g/dL (ref 13.0–17.7)
MCH: 30.8 pg (ref 26.6–33.0)
MCHC: 33 g/dL (ref 31.5–35.7)
MCV: 93 fL (ref 79–97)
Platelets: 258 10*3/uL (ref 150–450)
RBC: 4.8 x10E6/uL (ref 4.14–5.80)
RDW: 13.1 % (ref 11.6–15.4)
WBC: 5.3 10*3/uL (ref 3.4–10.8)

## 2020-02-23 LAB — FPSA% REFLEX
% FREE PSA: 9.3 %
PSA, FREE: 0.43 ng/mL

## 2020-02-23 LAB — HEPATITIS C ANTIBODY: Hep C Virus Ab: <0.1 s/co ratio (ref 0.0–0.9)

## 2020-02-24 NOTE — Addendum Note (Signed)
Addended by: Malva Limes on: 02/24/2020 09:03 AM   Modules accepted: Orders

## 2020-02-29 DIAGNOSIS — F411 Generalized anxiety disorder: Secondary | ICD-10-CM | POA: Diagnosis not present

## 2020-02-29 DIAGNOSIS — F331 Major depressive disorder, recurrent, moderate: Secondary | ICD-10-CM | POA: Diagnosis not present

## 2020-03-08 DIAGNOSIS — F331 Major depressive disorder, recurrent, moderate: Secondary | ICD-10-CM | POA: Diagnosis not present

## 2020-03-22 DIAGNOSIS — F331 Major depressive disorder, recurrent, moderate: Secondary | ICD-10-CM | POA: Diagnosis not present

## 2020-03-26 ENCOUNTER — Other Ambulatory Visit: Payer: Self-pay | Admitting: Family Medicine

## 2020-03-26 NOTE — Telephone Encounter (Signed)
Requested Prescriptions  Pending Prescriptions Disp Refills   losartan (COZAAR) 100 MG tablet [Pharmacy Med Name: LOSARTAN POTASSIUM 100 MG TAB] 90 tablet 1    Sig: TAKE 1 TABLET BY MOUTH EVERY DAY     Cardiovascular:  Angiotensin Receptor Blockers Failed - 03/26/2020 12:33 PM      Failed - Cr in normal range and within 180 days    Creatinine, Ser  Date Value Ref Range Status  02/22/2020 1.45 (H) 0.76 - 1.27 mg/dL Final         Passed - K in normal range and within 180 days    Potassium  Date Value Ref Range Status  02/22/2020 4.7 3.5 - 5.2 mmol/L Final         Passed - Patient is not pregnant      Passed - Last BP in normal range    BP Readings from Last 1 Encounters:  02/22/20 124/81         Passed - Valid encounter within last 6 months    Recent Outpatient Visits          1 month ago Essential (primary) hypertension   [facility] Malva Limes, MD   2 years ago Essential (primary) hypertension   [facility] Malva Limes, MD   2 years ago Cellulitis of right thumb   [facility] Malva Limes, MD   2 years ago Essential (primary) hypertension   [facility] Malva Limes, MD   3 years ago Essential (primary) hypertension   [facility] Fisher, Demetrios Isaacs, MD

## 2020-03-28 DIAGNOSIS — F331 Major depressive disorder, recurrent, moderate: Secondary | ICD-10-CM | POA: Diagnosis not present

## 2020-03-28 DIAGNOSIS — F411 Generalized anxiety disorder: Secondary | ICD-10-CM | POA: Diagnosis not present

## 2020-04-04 ENCOUNTER — Telehealth: Payer: Self-pay | Admitting: Family Medicine

## 2020-04-04 DIAGNOSIS — R972 Elevated prostate specific antigen [PSA]: Secondary | ICD-10-CM

## 2020-04-04 NOTE — Telephone Encounter (Signed)
Please remind patient that it is time to recheck PSA since it was elevated when we checked in May. Future order is already in chart.

## 2020-04-04 NOTE — Telephone Encounter (Signed)
Attempted to contact patient, no answer left voicemail on both contact numbers in chart. Okay for [facility] to advise patient.

## 2020-04-05 NOTE — Telephone Encounter (Signed)
I called Kurt Edwards and gave him the message from Dr. Sherrie Mustache dated 04/04/2020 at 4:35 PM regarding his PSA needing to be checked.  I let him know the lab hours and that he did not need an appt or to fast.       The order is in Epic for the PSA.   Kurt Edwards agreeable.

## 2020-04-07 ENCOUNTER — Other Ambulatory Visit: Payer: Self-pay

## 2020-04-07 DIAGNOSIS — F331 Major depressive disorder, recurrent, moderate: Secondary | ICD-10-CM | POA: Diagnosis not present

## 2020-04-07 DIAGNOSIS — R972 Elevated prostate specific antigen [PSA]: Secondary | ICD-10-CM | POA: Diagnosis not present

## 2020-04-08 ENCOUNTER — Telehealth: Payer: Self-pay

## 2020-04-08 DIAGNOSIS — R972 Elevated prostate specific antigen [PSA]: Secondary | ICD-10-CM

## 2020-04-08 LAB — PSA: Prostate Specific Ag, Serum: 4.1 ng/mL — ABNORMAL HIGH (ref 0.0–4.0)

## 2020-04-08 NOTE — Telephone Encounter (Signed)
Patient advised. Referral placed  

## 2020-04-08 NOTE — Telephone Encounter (Signed)
-----   Message from Malva Limes, MD sent at 04/08/2020  7:45 AM EDT ----- PSA is down slightly, but still elevated. He needs referral to urology to follow.

## 2020-04-19 DIAGNOSIS — F331 Major depressive disorder, recurrent, moderate: Secondary | ICD-10-CM | POA: Diagnosis not present

## 2020-04-23 NOTE — Progress Notes (Signed)
04/25/2020 2:28 PM   Kurt Edwards [birth year] [id number]  Referring provider: Malva Limes, MD [address] [place],  [place] [address] Chief Complaint  Patient presents with  . Elevated PSA    HPI: Kurt Edwards is a 64 y.o. male who presents today for an evaluation and management of an elevated PSA.   -No pain or dysuria. -No hematuria. -No family history prostate cancer first-degree relatives  -Has some erectile dysfunction. Tried Cialis in the past. -Organic risk factors hypertension, hyperlipidemia and antihypertensive medication -no prior tobacco history   PSA Trend: Component     Latest Ref Rng & Units 08/31/2016 01/08/2018 02/22/2020 04/07/2020  Prostate Specific Ag, Serum     0.0 - 4.0 ng/mL 2.6 3.8 4.6 (H) 4.1 (H)    PMH: Past Medical History:  Diagnosis Date  . Anxiety   . Hypercholesteremia   . Hypertension     Surgical History: Past Surgical History:  Procedure Laterality Date  . CHOLECYSTECTOMY    . NASAL SEPTUM SURGERY    . OTHER SURGICAL HISTORY     arm surgery, right, fracture, 2009  . PALATE / UVULA BIOPSY / EXCISION    . TONSILLECTOMY    . UPPER GI ENDOSCOPY  09/27/11   H.Pylori negative, reactive gastropathy, no Barretts.    Home Medications:  Allergies as of 04/25/2020      Reactions   Amlodipine Besylate Hives   Possible reaction to amlodipine   Atorvastatin    Other reaction(s): Muscle Pain      Medication List       Accurate as of April 25, 2020  2:28 PM. If you have any questions, ask your nurse or doctor.        buPROPion 150 MG 24 hr tablet Commonly known as: WELLBUTRIN XL Take 150 mg by mouth every morning.   clonazePAM 0.5 MG tablet Commonly known as: KLONOPIN TAKE 1 DAILY AS NEEDED   fluticasone 50 MCG/ACT nasal spray Commonly known as: FLONASE Place 2 sprays into both nostrils daily.   losartan 100 MG tablet Commonly known as: COZAAR TAKE 1 TABLET BY MOUTH EVERY DAY   Rexulti 1 MG Tabs  tablet Generic drug: brexpiprazole Take by mouth daily.   rosuvastatin 5 MG tablet Commonly known as: CRESTOR TAKE 1 TABLET BY MOUTH EVERY DAY   sertraline 100 MG tablet Commonly known as: ZOLOFT Take 100 mg by mouth daily.   tadalafil 20 MG tablet Commonly known as: CIALIS TAKE 1 TABLET DAILY AS NEEDED   venlafaxine XR 150 MG 24 hr capsule Commonly known as: EFFEXOR-XR Take 150 mg by mouth daily.   venlafaxine XR 75 MG 24 hr capsule Commonly known as: EFFEXOR-XR Take 75 mg by mouth daily.       Allergies:  Allergies  Allergen Reactions  . Amlodipine Besylate Hives    Possible reaction to amlodipine  . Atorvastatin     Other reaction(s): Muscle Pain    Family History: Family History  Problem Relation Age of Onset  . Hypertension Mother   . Emphysema Father     Social History:  reports that he has never smoked. He has never used smokeless tobacco. He reports that he does not drink alcohol and does not use drugs.   Physical Exam: BP 135/81   Pulse 94   Ht 5\' 11"  (1.803 m)   Wt 186 lb (84.4 kg)   BMI 25.94 kg/m   Constitutional:  Alert and oriented, No acute distress. HEENT:  [place] AT, moist mucus membranes.  Trachea midline, no masses. Cardiovascular: No clubbing, cyanosis, or edema. Respiratory: Normal respiratory effort, no increased work of breathing. GI: Abdomen is soft, nontender, nondistended, no abdominal masses; bilateral inguinal hernias; small and reducible GU: Phallus without lesions, testes descended bilaterally without masses or tenderness; prostate 40 g, smooth without nodules; L >R however consistency normal and uniform throughout Skin: No rashes, bruises or suspicious lesions. Neurologic: Grossly intact, no focal deficits, moving all 4 extremities. Psychiatric: Normal mood and affect.  Laboratory Data:  Lab Results  Component Value Date   CREATININE 1.45 (H) 02/22/2020    Assessment & Plan:    1. Elevated PSA -Benign DRE  - Although  PSA is a prostate cancer screening test he was informed that cancer is not the most common cause of an elevated PSA. Other potential causes including BPH and inflammation were discussed. He was informed that the only way to adequately diagnose prostate cancer would be a transrectal ultrasound and biopsy of the prostate. The procedure was discussed including potential risks of bleeding and infection/sepsis. He was also informed that a negative biopsy does not conclusively rule out the possibility that prostate cancer may be present and that continued monitoring is required. The use of newer adjunctive blood tests including PHI and 4kScore were discussed. The use of multiparametric prostate MRI was also discussed however is not typically used for initial evaluation of an elevated PSA. Continued periodic surveillance was also discussed.  -Patient elected to continue surveillance.   2. Erectile dysfunction - Rx Tadalafil given.   3.  BPH with LUTS -Mild voiding symptoms   [facility] [address], [address] [place], [place] [address] [phone]  I, Theador Hawthorne, am acting as a scribe for Dr. Lorin Picket C. Raguel Kosloski,  I have reviewed the above documentation for accuracy and completeness, and I agree with the above.   Riki Altes, MD

## 2020-04-25 ENCOUNTER — Ambulatory Visit: Payer: Federal, State, Local not specified - PPO | Admitting: Urology

## 2020-04-25 ENCOUNTER — Other Ambulatory Visit: Payer: Self-pay

## 2020-04-25 ENCOUNTER — Encounter: Payer: Self-pay | Admitting: Urology

## 2020-04-25 VITALS — BP 135/81 | HR 94 | Ht 71.0 in | Wt 186.0 lb

## 2020-04-25 DIAGNOSIS — N529 Male erectile dysfunction, unspecified: Secondary | ICD-10-CM

## 2020-04-25 DIAGNOSIS — N401 Enlarged prostate with lower urinary tract symptoms: Secondary | ICD-10-CM

## 2020-04-25 DIAGNOSIS — R972 Elevated prostate specific antigen [PSA]: Secondary | ICD-10-CM

## 2020-04-25 MED ORDER — TADALAFIL 20 MG PO TABS: ORAL_TABLET | ORAL | 0 refills | Status: DC

## 2020-05-02 DIAGNOSIS — F331 Major depressive disorder, recurrent, moderate: Secondary | ICD-10-CM | POA: Diagnosis not present

## 2020-05-02 DIAGNOSIS — F411 Generalized anxiety disorder: Secondary | ICD-10-CM | POA: Diagnosis not present

## 2020-05-11 DIAGNOSIS — F331 Major depressive disorder, recurrent, moderate: Secondary | ICD-10-CM | POA: Diagnosis not present

## 2020-05-24 DIAGNOSIS — F331 Major depressive disorder, recurrent, moderate: Secondary | ICD-10-CM | POA: Diagnosis not present

## 2020-06-06 DIAGNOSIS — F411 Generalized anxiety disorder: Secondary | ICD-10-CM | POA: Diagnosis not present

## 2020-06-06 DIAGNOSIS — F331 Major depressive disorder, recurrent, moderate: Secondary | ICD-10-CM | POA: Diagnosis not present

## 2020-06-08 DIAGNOSIS — F331 Major depressive disorder, recurrent, moderate: Secondary | ICD-10-CM | POA: Diagnosis not present

## 2020-06-23 DIAGNOSIS — H1045 Other chronic allergic conjunctivitis: Secondary | ICD-10-CM | POA: Diagnosis not present

## 2020-06-23 DIAGNOSIS — J301 Allergic rhinitis due to pollen: Secondary | ICD-10-CM | POA: Diagnosis not present

## 2020-06-23 DIAGNOSIS — J3081 Allergic rhinitis due to animal (cat) (dog) hair and dander: Secondary | ICD-10-CM | POA: Diagnosis not present

## 2020-06-23 DIAGNOSIS — J3089 Other allergic rhinitis: Secondary | ICD-10-CM | POA: Diagnosis not present

## 2020-06-28 DIAGNOSIS — F331 Major depressive disorder, recurrent, moderate: Secondary | ICD-10-CM | POA: Diagnosis not present

## 2020-07-06 ENCOUNTER — Other Ambulatory Visit: Payer: Self-pay | Admitting: Urology

## 2020-07-07 DIAGNOSIS — J301 Allergic rhinitis due to pollen: Secondary | ICD-10-CM | POA: Diagnosis not present

## 2020-07-07 DIAGNOSIS — J3089 Other allergic rhinitis: Secondary | ICD-10-CM | POA: Diagnosis not present

## 2020-07-07 DIAGNOSIS — J3081 Allergic rhinitis due to animal (cat) (dog) hair and dander: Secondary | ICD-10-CM | POA: Diagnosis not present

## 2020-07-08 DIAGNOSIS — J3089 Other allergic rhinitis: Secondary | ICD-10-CM | POA: Diagnosis not present

## 2020-07-08 DIAGNOSIS — J301 Allergic rhinitis due to pollen: Secondary | ICD-10-CM | POA: Diagnosis not present

## 2020-07-08 DIAGNOSIS — J3081 Allergic rhinitis due to animal (cat) (dog) hair and dander: Secondary | ICD-10-CM | POA: Diagnosis not present

## 2020-07-11 DIAGNOSIS — F331 Major depressive disorder, recurrent, moderate: Secondary | ICD-10-CM | POA: Diagnosis not present

## 2020-07-11 DIAGNOSIS — F411 Generalized anxiety disorder: Secondary | ICD-10-CM | POA: Diagnosis not present

## 2020-07-13 DIAGNOSIS — F331 Major depressive disorder, recurrent, moderate: Secondary | ICD-10-CM | POA: Diagnosis not present

## 2020-07-27 DIAGNOSIS — F331 Major depressive disorder, recurrent, moderate: Secondary | ICD-10-CM | POA: Diagnosis not present

## 2020-08-09 DIAGNOSIS — F331 Major depressive disorder, recurrent, moderate: Secondary | ICD-10-CM | POA: Diagnosis not present

## 2020-08-22 DIAGNOSIS — F411 Generalized anxiety disorder: Secondary | ICD-10-CM | POA: Diagnosis not present

## 2020-08-22 DIAGNOSIS — F331 Major depressive disorder, recurrent, moderate: Secondary | ICD-10-CM | POA: Diagnosis not present

## 2020-08-23 DIAGNOSIS — F332 Major depressive disorder, recurrent severe without psychotic features: Secondary | ICD-10-CM | POA: Diagnosis not present

## 2020-09-12 DIAGNOSIS — F411 Generalized anxiety disorder: Secondary | ICD-10-CM | POA: Diagnosis not present

## 2020-09-12 DIAGNOSIS — F331 Major depressive disorder, recurrent, moderate: Secondary | ICD-10-CM | POA: Diagnosis not present

## 2020-09-13 DIAGNOSIS — F332 Major depressive disorder, recurrent severe without psychotic features: Secondary | ICD-10-CM | POA: Diagnosis not present

## 2020-09-20 DIAGNOSIS — J310 Chronic rhinitis: Secondary | ICD-10-CM | POA: Diagnosis not present

## 2020-09-20 DIAGNOSIS — H6981 Other specified disorders of Eustachian tube, right ear: Secondary | ICD-10-CM | POA: Diagnosis not present

## 2020-09-20 DIAGNOSIS — H903 Sensorineural hearing loss, bilateral: Secondary | ICD-10-CM | POA: Diagnosis not present

## 2020-09-20 DIAGNOSIS — J343 Hypertrophy of nasal turbinates: Secondary | ICD-10-CM | POA: Diagnosis not present

## 2020-09-26 ENCOUNTER — Other Ambulatory Visit: Payer: Self-pay | Admitting: Family Medicine

## 2020-09-28 DIAGNOSIS — F332 Major depressive disorder, recurrent severe without psychotic features: Secondary | ICD-10-CM | POA: Diagnosis not present

## 2020-10-10 ENCOUNTER — Other Ambulatory Visit: Payer: Self-pay | Admitting: Family Medicine

## 2020-10-10 NOTE — Telephone Encounter (Signed)
Requested medication (s) are due for refill today: no-   Requested medication (s) are on the active medication list: yes  Last refill:  09/26/20 - was a 30 day courtesy RF  Future visit scheduled: no  Notes to clinic:  called pt and LM on VM to call office to schedule f/u appt- 30 day courtesy RF previously given   Requested Prescriptions  Pending Prescriptions Disp Refills   losartan (COZAAR) 100 MG tablet [Pharmacy Med Name: LOSARTAN POTASSIUM 100 MG TAB] 90 tablet 1    Sig: TAKE 1 TABLET BY MOUTH EVERY DAY      Cardiovascular:  Angiotensin Receptor Blockers Failed - 10/10/2020  8:58 AM      Failed - Cr in normal range and within 180 days    Creatinine, Ser  Date Value Ref Range Status  02/22/2020 1.45 (H) 0.76 - 1.27 mg/dL Final          Failed - K in normal range and within 180 days    Potassium  Date Value Ref Range Status  02/22/2020 4.7 3.5 - 5.2 mmol/L Final          Failed - Valid encounter within last 6 months    Recent Outpatient Visits           7 months ago Essential (primary) hypertension   [facility] Malva Limes, MD   2 years ago Essential (primary) hypertension   [facility], Demetrios Isaacs, MD   3 years ago Cellulitis of right thumb   [facility] Malva Limes, MD   3 years ago Essential (primary) hypertension   [facility], Demetrios Isaacs, MD   4 years ago Essential (primary) hypertension   [facility] Fisher, Demetrios Isaacs, MD       Future Appointments             In 2 weeks Stoioff, Verna Czech, MD [facility]             Passed - Patient is not pregnant      Passed - Last BP in normal range    BP Readings from Last 1 Encounters:  04/25/20 135/81

## 2020-10-10 NOTE — Telephone Encounter (Signed)
Please review. Thanks!  

## 2020-10-19 DIAGNOSIS — F332 Major depressive disorder, recurrent severe without psychotic features: Secondary | ICD-10-CM | POA: Diagnosis not present

## 2020-10-21 ENCOUNTER — Other Ambulatory Visit: Payer: Self-pay

## 2020-10-24 DIAGNOSIS — F331 Major depressive disorder, recurrent, moderate: Secondary | ICD-10-CM | POA: Diagnosis not present

## 2020-10-24 DIAGNOSIS — F411 Generalized anxiety disorder: Secondary | ICD-10-CM | POA: Diagnosis not present

## 2020-10-26 ENCOUNTER — Ambulatory Visit: Payer: Self-pay | Admitting: Urology

## 2020-11-08 DIAGNOSIS — F332 Major depressive disorder, recurrent severe without psychotic features: Secondary | ICD-10-CM | POA: Diagnosis not present

## 2020-11-21 DIAGNOSIS — F411 Generalized anxiety disorder: Secondary | ICD-10-CM | POA: Diagnosis not present

## 2020-11-21 DIAGNOSIS — F331 Major depressive disorder, recurrent, moderate: Secondary | ICD-10-CM | POA: Diagnosis not present

## 2020-11-29 DIAGNOSIS — J301 Allergic rhinitis due to pollen: Secondary | ICD-10-CM | POA: Diagnosis not present

## 2020-11-29 DIAGNOSIS — J3089 Other allergic rhinitis: Secondary | ICD-10-CM | POA: Diagnosis not present

## 2020-11-29 DIAGNOSIS — F332 Major depressive disorder, recurrent severe without psychotic features: Secondary | ICD-10-CM | POA: Diagnosis not present

## 2020-11-29 DIAGNOSIS — J3081 Allergic rhinitis due to animal (cat) (dog) hair and dander: Secondary | ICD-10-CM | POA: Diagnosis not present

## 2020-12-01 DIAGNOSIS — J3081 Allergic rhinitis due to animal (cat) (dog) hair and dander: Secondary | ICD-10-CM | POA: Diagnosis not present

## 2020-12-01 DIAGNOSIS — J3089 Other allergic rhinitis: Secondary | ICD-10-CM | POA: Diagnosis not present

## 2020-12-01 DIAGNOSIS — J301 Allergic rhinitis due to pollen: Secondary | ICD-10-CM | POA: Diagnosis not present

## 2020-12-06 DIAGNOSIS — J3081 Allergic rhinitis due to animal (cat) (dog) hair and dander: Secondary | ICD-10-CM | POA: Diagnosis not present

## 2020-12-06 DIAGNOSIS — J3089 Other allergic rhinitis: Secondary | ICD-10-CM | POA: Diagnosis not present

## 2020-12-06 DIAGNOSIS — J301 Allergic rhinitis due to pollen: Secondary | ICD-10-CM | POA: Diagnosis not present

## 2020-12-08 DIAGNOSIS — J301 Allergic rhinitis due to pollen: Secondary | ICD-10-CM | POA: Diagnosis not present

## 2020-12-08 DIAGNOSIS — J3089 Other allergic rhinitis: Secondary | ICD-10-CM | POA: Diagnosis not present

## 2020-12-08 DIAGNOSIS — J3081 Allergic rhinitis due to animal (cat) (dog) hair and dander: Secondary | ICD-10-CM | POA: Diagnosis not present

## 2020-12-12 DIAGNOSIS — F331 Major depressive disorder, recurrent, moderate: Secondary | ICD-10-CM | POA: Diagnosis not present

## 2020-12-12 DIAGNOSIS — F411 Generalized anxiety disorder: Secondary | ICD-10-CM | POA: Diagnosis not present

## 2020-12-13 DIAGNOSIS — F332 Major depressive disorder, recurrent severe without psychotic features: Secondary | ICD-10-CM | POA: Diagnosis not present

## 2020-12-13 DIAGNOSIS — J301 Allergic rhinitis due to pollen: Secondary | ICD-10-CM | POA: Diagnosis not present

## 2020-12-13 DIAGNOSIS — J3081 Allergic rhinitis due to animal (cat) (dog) hair and dander: Secondary | ICD-10-CM | POA: Diagnosis not present

## 2020-12-13 DIAGNOSIS — J3089 Other allergic rhinitis: Secondary | ICD-10-CM | POA: Diagnosis not present

## 2020-12-15 DIAGNOSIS — J301 Allergic rhinitis due to pollen: Secondary | ICD-10-CM | POA: Diagnosis not present

## 2020-12-15 DIAGNOSIS — J3081 Allergic rhinitis due to animal (cat) (dog) hair and dander: Secondary | ICD-10-CM | POA: Diagnosis not present

## 2020-12-15 DIAGNOSIS — J3089 Other allergic rhinitis: Secondary | ICD-10-CM | POA: Diagnosis not present

## 2020-12-20 DIAGNOSIS — J3089 Other allergic rhinitis: Secondary | ICD-10-CM | POA: Diagnosis not present

## 2020-12-20 DIAGNOSIS — J301 Allergic rhinitis due to pollen: Secondary | ICD-10-CM | POA: Diagnosis not present

## 2020-12-20 DIAGNOSIS — J3081 Allergic rhinitis due to animal (cat) (dog) hair and dander: Secondary | ICD-10-CM | POA: Diagnosis not present

## 2020-12-22 DIAGNOSIS — J301 Allergic rhinitis due to pollen: Secondary | ICD-10-CM | POA: Diagnosis not present

## 2020-12-22 DIAGNOSIS — J3089 Other allergic rhinitis: Secondary | ICD-10-CM | POA: Diagnosis not present

## 2020-12-22 DIAGNOSIS — J3081 Allergic rhinitis due to animal (cat) (dog) hair and dander: Secondary | ICD-10-CM | POA: Diagnosis not present

## 2020-12-27 DIAGNOSIS — J3081 Allergic rhinitis due to animal (cat) (dog) hair and dander: Secondary | ICD-10-CM | POA: Diagnosis not present

## 2020-12-27 DIAGNOSIS — J3089 Other allergic rhinitis: Secondary | ICD-10-CM | POA: Diagnosis not present

## 2020-12-27 DIAGNOSIS — J301 Allergic rhinitis due to pollen: Secondary | ICD-10-CM | POA: Diagnosis not present

## 2020-12-27 DIAGNOSIS — F332 Major depressive disorder, recurrent severe without psychotic features: Secondary | ICD-10-CM | POA: Diagnosis not present

## 2020-12-29 DIAGNOSIS — J3081 Allergic rhinitis due to animal (cat) (dog) hair and dander: Secondary | ICD-10-CM | POA: Diagnosis not present

## 2020-12-29 DIAGNOSIS — J301 Allergic rhinitis due to pollen: Secondary | ICD-10-CM | POA: Diagnosis not present

## 2020-12-29 DIAGNOSIS — J3089 Other allergic rhinitis: Secondary | ICD-10-CM | POA: Diagnosis not present

## 2021-01-02 DIAGNOSIS — F331 Major depressive disorder, recurrent, moderate: Secondary | ICD-10-CM | POA: Diagnosis not present

## 2021-01-02 DIAGNOSIS — F411 Generalized anxiety disorder: Secondary | ICD-10-CM | POA: Diagnosis not present

## 2021-01-03 DIAGNOSIS — J3081 Allergic rhinitis due to animal (cat) (dog) hair and dander: Secondary | ICD-10-CM | POA: Diagnosis not present

## 2021-01-03 DIAGNOSIS — J3089 Other allergic rhinitis: Secondary | ICD-10-CM | POA: Diagnosis not present

## 2021-01-03 DIAGNOSIS — J301 Allergic rhinitis due to pollen: Secondary | ICD-10-CM | POA: Diagnosis not present

## 2021-01-05 DIAGNOSIS — J301 Allergic rhinitis due to pollen: Secondary | ICD-10-CM | POA: Diagnosis not present

## 2021-01-05 DIAGNOSIS — J3089 Other allergic rhinitis: Secondary | ICD-10-CM | POA: Diagnosis not present

## 2021-01-05 DIAGNOSIS — J3081 Allergic rhinitis due to animal (cat) (dog) hair and dander: Secondary | ICD-10-CM | POA: Diagnosis not present

## 2021-01-10 DIAGNOSIS — J301 Allergic rhinitis due to pollen: Secondary | ICD-10-CM | POA: Diagnosis not present

## 2021-01-10 DIAGNOSIS — J3089 Other allergic rhinitis: Secondary | ICD-10-CM | POA: Diagnosis not present

## 2021-01-10 DIAGNOSIS — J3081 Allergic rhinitis due to animal (cat) (dog) hair and dander: Secondary | ICD-10-CM | POA: Diagnosis not present

## 2021-01-11 DIAGNOSIS — F332 Major depressive disorder, recurrent severe without psychotic features: Secondary | ICD-10-CM | POA: Diagnosis not present

## 2021-01-12 DIAGNOSIS — J3089 Other allergic rhinitis: Secondary | ICD-10-CM | POA: Diagnosis not present

## 2021-01-12 DIAGNOSIS — J3081 Allergic rhinitis due to animal (cat) (dog) hair and dander: Secondary | ICD-10-CM | POA: Diagnosis not present

## 2021-01-12 DIAGNOSIS — J301 Allergic rhinitis due to pollen: Secondary | ICD-10-CM | POA: Diagnosis not present

## 2021-01-17 DIAGNOSIS — K0263 Dental caries on smooth surface penetrating into pulp: Secondary | ICD-10-CM | POA: Diagnosis not present

## 2021-01-19 DIAGNOSIS — J3089 Other allergic rhinitis: Secondary | ICD-10-CM | POA: Diagnosis not present

## 2021-01-19 DIAGNOSIS — J3081 Allergic rhinitis due to animal (cat) (dog) hair and dander: Secondary | ICD-10-CM | POA: Diagnosis not present

## 2021-01-19 DIAGNOSIS — J301 Allergic rhinitis due to pollen: Secondary | ICD-10-CM | POA: Diagnosis not present

## 2021-01-24 DIAGNOSIS — J3081 Allergic rhinitis due to animal (cat) (dog) hair and dander: Secondary | ICD-10-CM | POA: Diagnosis not present

## 2021-01-24 DIAGNOSIS — J3089 Other allergic rhinitis: Secondary | ICD-10-CM | POA: Diagnosis not present

## 2021-01-24 DIAGNOSIS — J301 Allergic rhinitis due to pollen: Secondary | ICD-10-CM | POA: Diagnosis not present

## 2021-01-25 DIAGNOSIS — F332 Major depressive disorder, recurrent severe without psychotic features: Secondary | ICD-10-CM | POA: Diagnosis not present

## 2021-01-26 DIAGNOSIS — J301 Allergic rhinitis due to pollen: Secondary | ICD-10-CM | POA: Diagnosis not present

## 2021-01-26 DIAGNOSIS — J3089 Other allergic rhinitis: Secondary | ICD-10-CM | POA: Diagnosis not present

## 2021-01-26 DIAGNOSIS — J3081 Allergic rhinitis due to animal (cat) (dog) hair and dander: Secondary | ICD-10-CM | POA: Diagnosis not present

## 2021-01-30 DIAGNOSIS — F331 Major depressive disorder, recurrent, moderate: Secondary | ICD-10-CM | POA: Diagnosis not present

## 2021-01-30 DIAGNOSIS — F411 Generalized anxiety disorder: Secondary | ICD-10-CM | POA: Diagnosis not present

## 2021-01-31 DIAGNOSIS — J3089 Other allergic rhinitis: Secondary | ICD-10-CM | POA: Diagnosis not present

## 2021-01-31 DIAGNOSIS — J3081 Allergic rhinitis due to animal (cat) (dog) hair and dander: Secondary | ICD-10-CM | POA: Diagnosis not present

## 2021-01-31 DIAGNOSIS — J301 Allergic rhinitis due to pollen: Secondary | ICD-10-CM | POA: Diagnosis not present

## 2021-02-02 DIAGNOSIS — J3081 Allergic rhinitis due to animal (cat) (dog) hair and dander: Secondary | ICD-10-CM | POA: Diagnosis not present

## 2021-02-02 DIAGNOSIS — J3089 Other allergic rhinitis: Secondary | ICD-10-CM | POA: Diagnosis not present

## 2021-02-02 DIAGNOSIS — J301 Allergic rhinitis due to pollen: Secondary | ICD-10-CM | POA: Diagnosis not present

## 2021-02-08 DIAGNOSIS — F332 Major depressive disorder, recurrent severe without psychotic features: Secondary | ICD-10-CM | POA: Diagnosis not present

## 2021-02-09 DIAGNOSIS — J3089 Other allergic rhinitis: Secondary | ICD-10-CM | POA: Diagnosis not present

## 2021-02-09 DIAGNOSIS — J3081 Allergic rhinitis due to animal (cat) (dog) hair and dander: Secondary | ICD-10-CM | POA: Diagnosis not present

## 2021-02-09 DIAGNOSIS — J301 Allergic rhinitis due to pollen: Secondary | ICD-10-CM | POA: Diagnosis not present

## 2021-02-14 DIAGNOSIS — J3081 Allergic rhinitis due to animal (cat) (dog) hair and dander: Secondary | ICD-10-CM | POA: Diagnosis not present

## 2021-02-14 DIAGNOSIS — J301 Allergic rhinitis due to pollen: Secondary | ICD-10-CM | POA: Diagnosis not present

## 2021-02-14 DIAGNOSIS — J3089 Other allergic rhinitis: Secondary | ICD-10-CM | POA: Diagnosis not present

## 2021-02-21 DIAGNOSIS — J301 Allergic rhinitis due to pollen: Secondary | ICD-10-CM | POA: Diagnosis not present

## 2021-02-21 DIAGNOSIS — J3081 Allergic rhinitis due to animal (cat) (dog) hair and dander: Secondary | ICD-10-CM | POA: Diagnosis not present

## 2021-02-21 DIAGNOSIS — F332 Major depressive disorder, recurrent severe without psychotic features: Secondary | ICD-10-CM | POA: Diagnosis not present

## 2021-02-21 DIAGNOSIS — J3089 Other allergic rhinitis: Secondary | ICD-10-CM | POA: Diagnosis not present

## 2021-02-27 DIAGNOSIS — F331 Major depressive disorder, recurrent, moderate: Secondary | ICD-10-CM | POA: Diagnosis not present

## 2021-02-27 DIAGNOSIS — F411 Generalized anxiety disorder: Secondary | ICD-10-CM | POA: Diagnosis not present

## 2021-02-28 DIAGNOSIS — J3081 Allergic rhinitis due to animal (cat) (dog) hair and dander: Secondary | ICD-10-CM | POA: Diagnosis not present

## 2021-02-28 DIAGNOSIS — F332 Major depressive disorder, recurrent severe without psychotic features: Secondary | ICD-10-CM | POA: Diagnosis not present

## 2021-02-28 DIAGNOSIS — J301 Allergic rhinitis due to pollen: Secondary | ICD-10-CM | POA: Diagnosis not present

## 2021-02-28 DIAGNOSIS — J3089 Other allergic rhinitis: Secondary | ICD-10-CM | POA: Diagnosis not present

## 2021-03-07 DIAGNOSIS — J301 Allergic rhinitis due to pollen: Secondary | ICD-10-CM | POA: Diagnosis not present

## 2021-03-07 DIAGNOSIS — J3089 Other allergic rhinitis: Secondary | ICD-10-CM | POA: Diagnosis not present

## 2021-03-07 DIAGNOSIS — J3081 Allergic rhinitis due to animal (cat) (dog) hair and dander: Secondary | ICD-10-CM | POA: Diagnosis not present

## 2021-03-14 DIAGNOSIS — J3081 Allergic rhinitis due to animal (cat) (dog) hair and dander: Secondary | ICD-10-CM | POA: Diagnosis not present

## 2021-03-14 DIAGNOSIS — J3089 Other allergic rhinitis: Secondary | ICD-10-CM | POA: Diagnosis not present

## 2021-03-14 DIAGNOSIS — J301 Allergic rhinitis due to pollen: Secondary | ICD-10-CM | POA: Diagnosis not present

## 2021-03-22 DIAGNOSIS — F332 Major depressive disorder, recurrent severe without psychotic features: Secondary | ICD-10-CM | POA: Diagnosis not present

## 2021-03-24 ENCOUNTER — Other Ambulatory Visit: Payer: Self-pay | Admitting: Family Medicine

## 2021-03-24 DIAGNOSIS — J301 Allergic rhinitis due to pollen: Secondary | ICD-10-CM

## 2021-03-27 DIAGNOSIS — F331 Major depressive disorder, recurrent, moderate: Secondary | ICD-10-CM | POA: Diagnosis not present

## 2021-03-27 DIAGNOSIS — F411 Generalized anxiety disorder: Secondary | ICD-10-CM | POA: Diagnosis not present

## 2021-03-28 DIAGNOSIS — J301 Allergic rhinitis due to pollen: Secondary | ICD-10-CM | POA: Diagnosis not present

## 2021-03-28 DIAGNOSIS — J3081 Allergic rhinitis due to animal (cat) (dog) hair and dander: Secondary | ICD-10-CM | POA: Diagnosis not present

## 2021-03-28 DIAGNOSIS — J3089 Other allergic rhinitis: Secondary | ICD-10-CM | POA: Diagnosis not present

## 2021-04-04 DIAGNOSIS — J301 Allergic rhinitis due to pollen: Secondary | ICD-10-CM | POA: Diagnosis not present

## 2021-04-04 DIAGNOSIS — J3081 Allergic rhinitis due to animal (cat) (dog) hair and dander: Secondary | ICD-10-CM | POA: Diagnosis not present

## 2021-04-04 DIAGNOSIS — J3089 Other allergic rhinitis: Secondary | ICD-10-CM | POA: Diagnosis not present

## 2021-04-05 DIAGNOSIS — F332 Major depressive disorder, recurrent severe without psychotic features: Secondary | ICD-10-CM | POA: Diagnosis not present

## 2021-04-12 ENCOUNTER — Other Ambulatory Visit: Payer: Self-pay | Admitting: Family Medicine

## 2021-04-18 DIAGNOSIS — F332 Major depressive disorder, recurrent severe without psychotic features: Secondary | ICD-10-CM | POA: Diagnosis not present

## 2021-04-24 DIAGNOSIS — F411 Generalized anxiety disorder: Secondary | ICD-10-CM | POA: Diagnosis not present

## 2021-04-24 DIAGNOSIS — F331 Major depressive disorder, recurrent, moderate: Secondary | ICD-10-CM | POA: Diagnosis not present

## 2021-05-02 DIAGNOSIS — F322 Major depressive disorder, single episode, severe without psychotic features: Secondary | ICD-10-CM | POA: Diagnosis not present

## 2021-05-02 DIAGNOSIS — J3081 Allergic rhinitis due to animal (cat) (dog) hair and dander: Secondary | ICD-10-CM | POA: Diagnosis not present

## 2021-05-02 DIAGNOSIS — J3089 Other allergic rhinitis: Secondary | ICD-10-CM | POA: Diagnosis not present

## 2021-05-02 DIAGNOSIS — J301 Allergic rhinitis due to pollen: Secondary | ICD-10-CM | POA: Diagnosis not present

## 2021-05-08 DIAGNOSIS — F411 Generalized anxiety disorder: Secondary | ICD-10-CM | POA: Diagnosis not present

## 2021-05-08 DIAGNOSIS — F331 Major depressive disorder, recurrent, moderate: Secondary | ICD-10-CM | POA: Diagnosis not present

## 2021-05-09 DIAGNOSIS — J3081 Allergic rhinitis due to animal (cat) (dog) hair and dander: Secondary | ICD-10-CM | POA: Diagnosis not present

## 2021-05-09 DIAGNOSIS — J3089 Other allergic rhinitis: Secondary | ICD-10-CM | POA: Diagnosis not present

## 2021-05-09 DIAGNOSIS — J301 Allergic rhinitis due to pollen: Secondary | ICD-10-CM | POA: Diagnosis not present

## 2021-05-16 DIAGNOSIS — J3089 Other allergic rhinitis: Secondary | ICD-10-CM | POA: Diagnosis not present

## 2021-05-16 DIAGNOSIS — J301 Allergic rhinitis due to pollen: Secondary | ICD-10-CM | POA: Diagnosis not present

## 2021-05-16 DIAGNOSIS — J3081 Allergic rhinitis due to animal (cat) (dog) hair and dander: Secondary | ICD-10-CM | POA: Diagnosis not present

## 2021-05-16 DIAGNOSIS — F332 Major depressive disorder, recurrent severe without psychotic features: Secondary | ICD-10-CM | POA: Diagnosis not present

## 2021-05-23 DIAGNOSIS — J3081 Allergic rhinitis due to animal (cat) (dog) hair and dander: Secondary | ICD-10-CM | POA: Diagnosis not present

## 2021-05-23 DIAGNOSIS — J3089 Other allergic rhinitis: Secondary | ICD-10-CM | POA: Diagnosis not present

## 2021-05-23 DIAGNOSIS — J301 Allergic rhinitis due to pollen: Secondary | ICD-10-CM | POA: Diagnosis not present

## 2021-05-30 DIAGNOSIS — J3089 Other allergic rhinitis: Secondary | ICD-10-CM | POA: Diagnosis not present

## 2021-05-30 DIAGNOSIS — J301 Allergic rhinitis due to pollen: Secondary | ICD-10-CM | POA: Diagnosis not present

## 2021-05-30 DIAGNOSIS — F332 Major depressive disorder, recurrent severe without psychotic features: Secondary | ICD-10-CM | POA: Diagnosis not present

## 2021-05-30 DIAGNOSIS — J3081 Allergic rhinitis due to animal (cat) (dog) hair and dander: Secondary | ICD-10-CM | POA: Diagnosis not present

## 2021-06-05 DIAGNOSIS — F411 Generalized anxiety disorder: Secondary | ICD-10-CM | POA: Diagnosis not present

## 2021-06-05 DIAGNOSIS — F331 Major depressive disorder, recurrent, moderate: Secondary | ICD-10-CM | POA: Diagnosis not present

## 2021-06-13 DIAGNOSIS — J3089 Other allergic rhinitis: Secondary | ICD-10-CM | POA: Diagnosis not present

## 2021-06-13 DIAGNOSIS — J301 Allergic rhinitis due to pollen: Secondary | ICD-10-CM | POA: Diagnosis not present

## 2021-06-13 DIAGNOSIS — J3081 Allergic rhinitis due to animal (cat) (dog) hair and dander: Secondary | ICD-10-CM | POA: Diagnosis not present

## 2021-06-23 ENCOUNTER — Other Ambulatory Visit: Payer: Self-pay | Admitting: Family Medicine

## 2021-06-23 DIAGNOSIS — J301 Allergic rhinitis due to pollen: Secondary | ICD-10-CM

## 2021-07-20 DIAGNOSIS — J3081 Allergic rhinitis due to animal (cat) (dog) hair and dander: Secondary | ICD-10-CM | POA: Diagnosis not present

## 2021-07-20 DIAGNOSIS — R059 Cough, unspecified: Secondary | ICD-10-CM | POA: Diagnosis not present

## 2021-07-20 DIAGNOSIS — H1045 Other chronic allergic conjunctivitis: Secondary | ICD-10-CM | POA: Diagnosis not present

## 2021-07-20 DIAGNOSIS — J301 Allergic rhinitis due to pollen: Secondary | ICD-10-CM | POA: Diagnosis not present

## 2021-07-20 DIAGNOSIS — J3089 Other allergic rhinitis: Secondary | ICD-10-CM | POA: Diagnosis not present

## 2021-07-25 DIAGNOSIS — J3081 Allergic rhinitis due to animal (cat) (dog) hair and dander: Secondary | ICD-10-CM | POA: Diagnosis not present

## 2021-07-25 DIAGNOSIS — J3089 Other allergic rhinitis: Secondary | ICD-10-CM | POA: Diagnosis not present

## 2021-07-25 DIAGNOSIS — J301 Allergic rhinitis due to pollen: Secondary | ICD-10-CM | POA: Diagnosis not present

## 2021-07-26 DIAGNOSIS — R69 Illness, unspecified: Secondary | ICD-10-CM | POA: Diagnosis not present

## 2021-07-26 DIAGNOSIS — F411 Generalized anxiety disorder: Secondary | ICD-10-CM | POA: Diagnosis not present

## 2021-07-26 DIAGNOSIS — F331 Major depressive disorder, recurrent, moderate: Secondary | ICD-10-CM | POA: Diagnosis not present

## 2021-08-08 DIAGNOSIS — J3089 Other allergic rhinitis: Secondary | ICD-10-CM | POA: Diagnosis not present

## 2021-08-08 DIAGNOSIS — J301 Allergic rhinitis due to pollen: Secondary | ICD-10-CM | POA: Diagnosis not present

## 2021-08-08 DIAGNOSIS — J3081 Allergic rhinitis due to animal (cat) (dog) hair and dander: Secondary | ICD-10-CM | POA: Diagnosis not present

## 2021-08-15 DIAGNOSIS — J3081 Allergic rhinitis due to animal (cat) (dog) hair and dander: Secondary | ICD-10-CM | POA: Diagnosis not present

## 2021-08-15 DIAGNOSIS — J3089 Other allergic rhinitis: Secondary | ICD-10-CM | POA: Diagnosis not present

## 2021-08-15 DIAGNOSIS — J301 Allergic rhinitis due to pollen: Secondary | ICD-10-CM | POA: Diagnosis not present

## 2021-08-21 DIAGNOSIS — F411 Generalized anxiety disorder: Secondary | ICD-10-CM | POA: Diagnosis not present

## 2021-08-21 DIAGNOSIS — F410 Panic disorder [episodic paroxysmal anxiety] without agoraphobia: Secondary | ICD-10-CM | POA: Diagnosis not present

## 2021-08-21 DIAGNOSIS — F331 Major depressive disorder, recurrent, moderate: Secondary | ICD-10-CM | POA: Diagnosis not present

## 2021-08-21 DIAGNOSIS — R69 Illness, unspecified: Secondary | ICD-10-CM | POA: Diagnosis not present

## 2021-08-22 DIAGNOSIS — J301 Allergic rhinitis due to pollen: Secondary | ICD-10-CM | POA: Diagnosis not present

## 2021-08-22 DIAGNOSIS — J3081 Allergic rhinitis due to animal (cat) (dog) hair and dander: Secondary | ICD-10-CM | POA: Diagnosis not present

## 2021-08-22 DIAGNOSIS — J3089 Other allergic rhinitis: Secondary | ICD-10-CM | POA: Diagnosis not present

## 2021-08-29 DIAGNOSIS — J301 Allergic rhinitis due to pollen: Secondary | ICD-10-CM | POA: Diagnosis not present

## 2021-08-29 DIAGNOSIS — J3089 Other allergic rhinitis: Secondary | ICD-10-CM | POA: Diagnosis not present

## 2021-08-29 DIAGNOSIS — J3081 Allergic rhinitis due to animal (cat) (dog) hair and dander: Secondary | ICD-10-CM | POA: Diagnosis not present

## 2021-09-04 NOTE — Progress Notes (Signed)
Established patient visit   Patient: Kurt Edwards   DOB: [birth year]   65 y.o. Male  MRN: [id number] Visit Date: 09/05/2021  Today's healthcare provider: Mila Merry, MD   Chief Complaint  Patient presents with   Hypertension   Prediabetes   Subjective    HPI  Hypertension, follow-up  BP Readings from Last 3 Encounters:  09/05/21 125/79  04/25/20 135/81  02/22/20 124/81   Wt Readings from Last 3 Encounters:  09/05/21 198 lb 6.4 oz (90 kg)  04/25/20 186 lb (84.4 kg)  02/22/20 191 lb (86.6 kg)     He was last seen for hypertension 6 months ago.  BP at that visit was 124/81. Management since that visit includes ordering labs which showed decline in kidney functions; patient was advised to drink more water .  He reports good compliance with treatment. He is not having side effects.  He is following a Regular diet. He is exercising. He does not smoke.  Use of agents associated with hypertension: none.   Outside blood pressures are not checked. Symptoms: No chest pain No chest pressure  No palpitations No syncope  No dyspnea No orthopnea  No paroxysmal nocturnal dyspnea No lower extremity edema   Pertinent labs: Lab Results  Component Value Date   NA 140 02/22/2020   K 4.7 02/22/2020   CREATININE 1.45 (H) 02/22/2020   GFRNONAA 51 (L) 02/22/2020   GLUCOSE 99 02/22/2020   TSH 1.370 01/08/2018     The 10-year ASCVD risk score (Arnett DK, et al., 2019) is: 12.4%* (Cholesterol units were assumed)   ---------------------------------------------------------------------------------------------------   Prediabetes, Follow-up  Lab Results  Component Value Date   HGBA1C 5.9 (H) 02/22/2020   HGBA1C 6.1 (H) 01/08/2018   GLUCOSE 99 02/22/2020   GLUCOSE 113 (H) 10/29/2017   GLUCOSE 102 (H) 08/31/2016    Last seen for for this 6 months ago.  Management since that visit includes continuing lifestyle modifications. Current symptoms include none and have been  stable.  Prior visit with dietician: no Current diet: in general, an "unhealthy" diet Current exercise: walking     -----------------------------------------------------------------------------------------   Lipid/Cholesterol, Follow-up  Last lipid panel Other pertinent labs  Lab Results  Component Value Date   CHOL 178 07/09/2019   HDL 55 07/09/2019   LDLCALC 99 07/09/2019   TRIG 134 07/09/2019   CHOLHDL 2.7 08/31/2016   Lab Results  Component Value Date   ALT 24 10/29/2017   AST 20 10/29/2017   PLT 258 02/22/2020   TSH 1.370 01/08/2018     He was last seen for this 1  year  ago.  Management since that visit includes continue same medication.  He reports good compliance with treatment (patient reports that he has not been taking rosuvastatin regularly).  He is not having side effects.   Symptoms: No chest pain No chest pressure/discomfort  No dyspnea No lower extremity edema  No numbness or tingling of extremity No orthopnea  No palpitations No paroxysmal nocturnal dyspnea  No speech difficulty No syncope   Current diet: in general, an "unhealthy" diet Current exercise: walking  The 10-year ASCVD risk score (Arnett DK, et al., 2019) is: 12.4%* (Cholesterol units were assumed)  ---------------------------------------------------------------------------------------------------     Medications: Outpatient Medications Prior to Visit  Medication Sig Note   ARIPiprazole (ABILIFY) 5 MG tablet Take 5 mg by mouth daily.    buPROPion (WELLBUTRIN XL) 150 MG 24 hr tablet Take 150 mg by mouth every  morning.    clonazePAM (KLONOPIN) 0.5 MG tablet TAKE 1 DAILY AS NEEDED    desvenlafaxine (PRISTIQ) 100 MG 24 hr tablet Take 100 mg by mouth daily.    fluticasone (FLONASE) 50 MCG/ACT nasal spray SPRAY 2 SPRAYS INTO EACH NOSTRIL EVERY DAY    losartan (COZAAR) 100 MG tablet TAKE 1 TABLET BY MOUTH EVERY DAY    tadalafil (CIALIS) 20 MG tablet TAKE ONE TABLET BY MOUTH ONE HOUR  PRIOR TO INTERCOURSE    rosuvastatin (CRESTOR) 5 MG tablet TAKE 1 TABLET BY MOUTH EVERY DAY (Patient not taking: Reported on 09/05/2021)    [DISCONTINUED] brexpiprazole (REXULTI) 1 MG TABS tablet Take by mouth daily. 09/05/2021: previously discontinued by psychiatry   [DISCONTINUED] sertraline (ZOLOFT) 100 MG tablet Take 100 mg by mouth daily.  09/05/2021: previously discontinued by psychiatry   [DISCONTINUED] venlafaxine XR (EFFEXOR-XR) 150 MG 24 hr capsule Take 150 mg by mouth daily. (Patient not taking: Reported on 04/25/2020) 09/05/2021: previously discontinued by psychiatry   [DISCONTINUED] venlafaxine XR (EFFEXOR-XR) 75 MG 24 hr capsule Take 75 mg by mouth daily.     No facility-administered medications prior to visit.    Review of Systems  Constitutional:  Negative for appetite change, chills and fever.  Respiratory:  Negative for chest tightness, shortness of breath and wheezing.   Cardiovascular:  Negative for chest pain and palpitations.  Gastrointestinal:  Negative for abdominal pain, nausea and vomiting.      Objective    BP 125/79 (BP Location: Left Arm, Patient Position: Sitting, Cuff Size: Normal)   Pulse 66   Temp 97.6 F (36.4 C) (Oral)   Resp 18   Ht 5\' 11"  (1.803 m)   Wt 198 lb 6.4 oz (90 kg)   SpO2 100% Comment: room air  BMI 27.67 kg/m  {Show previous vital signs (optional):23777}  Physical Exam  General appearance:  Well developed, well nourished male, cooperative and in no acute distress Head: Normocephalic, without obvious abnormality, atraumatic Respiratory: Respirations even and unlabored, normal respiratory rate Extremities: All extremities are intact.  Skin: Skin color, texture, turgor normal. No rashes seen  Psych: Appropriate mood and affect. Neurologic: Mental status: Alert, oriented to person, place, and time, thought content appropriate.   Results for orders placed or performed in visit on 09/05/21  POCT HgB A1C  Result Value Ref Range    Hemoglobin A1C 5.7 (A) 4.0 - 5.6 %   Est. average glucose Bld gHb Est-mCnc 117     Assessment & Plan     1. Prediabetes Controlled with diet.   2. Primary hypertension Well controlled.  Continue current medications.    3. Other fatigue  - CBC - TSH  4. Hyperlipidemia, unspecified hyperlipidemia type Has been off of rosuvastatin for several months, although he no adverse effects from medication.  - Comprehensive metabolic panel - Lipid panel  5. Stage 3 chronic kidney disease, unspecified whether stage 3a or 3b CKD (HCC)  - VITAMIN D 25 Hydroxy (Vit-D Deficiency, Fractures)  6. Obstructive apnea Off CPAP since UPP. Denies hypersomnia.   7. Prostate cancer screening  - PSA Total (Reflex To Free) ([facility] only)  8. Erectile dysfunction, unspecified erectile dysfunction type refill tadalafil (CIALIS) 20 MG tablet; TAKE ONE TABLET BY MOUTH ONE HOUR PRIOR TO INTERCOURSE. NO MORE THAN ONE IN A 24 HOURS PERIOD  Dispense: 10 tablet; Refill: 5  Recommended PCV20 but he would like to wait until after the holidays. States he has already had flu vaccine.  The entirety of the information documented in the History of Present Illness, Review of Systems and Physical Exam were personally obtained by me. Portions of this information were initially documented by the CMA and reviewed by me for thoroughness and accuracy.     Rennee Coyne, MD  [facility] [phone] (phone) [phone] (fax)  [facility]  

## 2021-09-05 ENCOUNTER — Other Ambulatory Visit: Payer: Self-pay

## 2021-09-05 ENCOUNTER — Encounter: Payer: Self-pay | Admitting: Family Medicine

## 2021-09-05 ENCOUNTER — Ambulatory Visit (INDEPENDENT_AMBULATORY_CARE_PROVIDER_SITE_OTHER): Payer: Medicare HMO | Admitting: Family Medicine

## 2021-09-05 VITALS — BP 125/79 | HR 66 | Temp 97.6°F | Resp 18 | Ht 71.0 in | Wt 198.4 lb

## 2021-09-05 DIAGNOSIS — Z125 Encounter for screening for malignant neoplasm of prostate: Secondary | ICD-10-CM

## 2021-09-05 DIAGNOSIS — J301 Allergic rhinitis due to pollen: Secondary | ICD-10-CM | POA: Diagnosis not present

## 2021-09-05 DIAGNOSIS — R5383 Other fatigue: Secondary | ICD-10-CM

## 2021-09-05 DIAGNOSIS — R7303 Prediabetes: Secondary | ICD-10-CM | POA: Diagnosis not present

## 2021-09-05 DIAGNOSIS — N529 Male erectile dysfunction, unspecified: Secondary | ICD-10-CM

## 2021-09-05 DIAGNOSIS — G4733 Obstructive sleep apnea (adult) (pediatric): Secondary | ICD-10-CM | POA: Diagnosis not present

## 2021-09-05 DIAGNOSIS — J3081 Allergic rhinitis due to animal (cat) (dog) hair and dander: Secondary | ICD-10-CM | POA: Diagnosis not present

## 2021-09-05 DIAGNOSIS — N183 Chronic kidney disease, stage 3 unspecified: Secondary | ICD-10-CM | POA: Diagnosis not present

## 2021-09-05 DIAGNOSIS — I10 Essential (primary) hypertension: Secondary | ICD-10-CM | POA: Diagnosis not present

## 2021-09-05 DIAGNOSIS — E785 Hyperlipidemia, unspecified: Secondary | ICD-10-CM

## 2021-09-05 DIAGNOSIS — J3089 Other allergic rhinitis: Secondary | ICD-10-CM | POA: Diagnosis not present

## 2021-09-05 LAB — POCT GLYCOSYLATED HEMOGLOBIN (HGB A1C)
Est. average glucose Bld gHb Est-mCnc: 117
Hemoglobin A1C: 5.7 % — AB (ref 4.0–5.6)

## 2021-09-05 MED ORDER — TADALAFIL 20 MG PO TABS: ORAL_TABLET | ORAL | 5 refills | Status: AC

## 2021-09-05 NOTE — Patient Instructions (Signed)
Please review the attached list of medications and notify my office if there are any errors.  ° °I recommend that you get the Prevnar 20 vaccine to protect yourself from certain dangerous strains of pneumonia. You can get Prevnar 20 at your pharmacy, or call our office at [phone] at your earliest convenience to schedule this vaccine.   °

## 2021-09-06 ENCOUNTER — Other Ambulatory Visit: Payer: Self-pay | Admitting: Family Medicine

## 2021-09-06 LAB — COMPREHENSIVE METABOLIC PANEL
ALT: 33 IU/L (ref 0–44)
AST: 20 IU/L (ref 0–40)
Albumin/Globulin Ratio: 2 (ref 1.2–2.2)
Albumin: 4.5 g/dL (ref 3.8–4.8)
Alkaline Phosphatase: 103 IU/L (ref 44–121)
BUN/Creatinine Ratio: 16 (ref 10–24)
BUN: 22 mg/dL (ref 8–27)
Bilirubin Total: 0.2 mg/dL (ref 0.0–1.2)
CO2: 24 mmol/L (ref 20–29)
Calcium: 9.4 mg/dL (ref 8.6–10.2)
Chloride: 106 mmol/L (ref 96–106)
Creatinine, Ser: 1.4 mg/dL — ABNORMAL HIGH (ref 0.76–1.27)
Globulin, Total: 2.2 g/dL (ref 1.5–4.5)
Glucose: 110 mg/dL — ABNORMAL HIGH (ref 70–99)
Potassium: 4.9 mmol/L (ref 3.5–5.2)
Sodium: 141 mmol/L (ref 134–144)
Total Protein: 6.7 g/dL (ref 6.0–8.5)
eGFR: 56 mL/min/{1.73_m2} — ABNORMAL LOW (ref >59)

## 2021-09-06 LAB — CBC
Hematocrit: 44.3 % (ref 37.5–51.0)
Hemoglobin: 14.7 g/dL (ref 13.0–17.7)
MCH: 30.7 pg (ref 26.6–33.0)
MCHC: 33.2 g/dL (ref 31.5–35.7)
MCV: 93 fL (ref 79–97)
Platelets: 271 10*3/uL (ref 150–450)
RBC: 4.79 x10E6/uL (ref 4.14–5.80)
RDW: 12.1 % (ref 11.6–15.4)
WBC: 4.6 10*3/uL (ref 3.4–10.8)

## 2021-09-06 LAB — PSA TOTAL (REFLEX TO FREE): Prostate Specific Ag, Serum: 4.7 ng/mL — ABNORMAL HIGH (ref 0.0–4.0)

## 2021-09-06 LAB — LIPID PANEL
Chol/HDL Ratio: 5 ratio (ref 0.0–5.0)
Cholesterol, Total: 232 mg/dL — ABNORMAL HIGH (ref 100–199)
HDL: 46 mg/dL (ref >39)
LDL Chol Calc (NIH): 153 mg/dL — ABNORMAL HIGH (ref 0–99)
Triglycerides: 180 mg/dL — ABNORMAL HIGH (ref 0–149)
VLDL Cholesterol Cal: 33 mg/dL (ref 5–40)

## 2021-09-06 LAB — FPSA% REFLEX
% FREE PSA: 7 %
PSA, FREE: 0.33 ng/mL

## 2021-09-06 LAB — VITAMIN D 25 HYDROXY (VIT D DEFICIENCY, FRACTURES): Vit D, 25-Hydroxy: 30.2 ng/mL (ref 30.0–100.0)

## 2021-09-06 LAB — TSH: TSH: 1.76 u[IU]/mL (ref 0.450–4.500)

## 2021-09-06 MED ORDER — ROSUVASTATIN CALCIUM 5 MG PO TABS: 5.0000 mg | ORAL_TABLET | Freq: Every day | ORAL | 1 refills | Status: DC

## 2021-09-12 DIAGNOSIS — J3089 Other allergic rhinitis: Secondary | ICD-10-CM | POA: Diagnosis not present

## 2021-09-12 DIAGNOSIS — J301 Allergic rhinitis due to pollen: Secondary | ICD-10-CM | POA: Diagnosis not present

## 2021-09-12 DIAGNOSIS — J3081 Allergic rhinitis due to animal (cat) (dog) hair and dander: Secondary | ICD-10-CM | POA: Diagnosis not present

## 2021-09-15 ENCOUNTER — Telehealth: Payer: Self-pay | Admitting: Family Medicine

## 2021-09-15 NOTE — Telephone Encounter (Signed)
our cholesterol is much too high at 232. It should be under 180. PSA is up slightly to 4.7.  Please start back on the rosuvastatin for cholesterol, prescription was sent to [facility] in [place].  Please schedule a follow up to check on sugar, cholesterol and PSA in May of 2023.

## 2021-09-15 NOTE — Telephone Encounter (Signed)
Pt advised apt for 02/13/2021   Thanks,   -Vernona Rieger

## 2021-09-18 DIAGNOSIS — F410 Panic disorder [episodic paroxysmal anxiety] without agoraphobia: Secondary | ICD-10-CM | POA: Diagnosis not present

## 2021-09-18 DIAGNOSIS — R69 Illness, unspecified: Secondary | ICD-10-CM | POA: Diagnosis not present

## 2021-09-18 DIAGNOSIS — F331 Major depressive disorder, recurrent, moderate: Secondary | ICD-10-CM | POA: Diagnosis not present

## 2021-09-18 DIAGNOSIS — F411 Generalized anxiety disorder: Secondary | ICD-10-CM | POA: Diagnosis not present

## 2021-09-19 DIAGNOSIS — J3089 Other allergic rhinitis: Secondary | ICD-10-CM | POA: Diagnosis not present

## 2021-09-19 DIAGNOSIS — J3081 Allergic rhinitis due to animal (cat) (dog) hair and dander: Secondary | ICD-10-CM | POA: Diagnosis not present

## 2021-09-19 DIAGNOSIS — J301 Allergic rhinitis due to pollen: Secondary | ICD-10-CM | POA: Diagnosis not present

## 2021-09-26 DIAGNOSIS — J3089 Other allergic rhinitis: Secondary | ICD-10-CM | POA: Diagnosis not present

## 2021-09-26 DIAGNOSIS — J3081 Allergic rhinitis due to animal (cat) (dog) hair and dander: Secondary | ICD-10-CM | POA: Diagnosis not present

## 2021-09-26 DIAGNOSIS — J301 Allergic rhinitis due to pollen: Secondary | ICD-10-CM | POA: Diagnosis not present

## 2021-10-03 DIAGNOSIS — J3081 Allergic rhinitis due to animal (cat) (dog) hair and dander: Secondary | ICD-10-CM | POA: Diagnosis not present

## 2021-10-03 DIAGNOSIS — J301 Allergic rhinitis due to pollen: Secondary | ICD-10-CM | POA: Diagnosis not present

## 2021-10-03 DIAGNOSIS — J3089 Other allergic rhinitis: Secondary | ICD-10-CM | POA: Diagnosis not present

## 2021-10-10 DIAGNOSIS — J3089 Other allergic rhinitis: Secondary | ICD-10-CM | POA: Diagnosis not present

## 2021-10-10 DIAGNOSIS — J301 Allergic rhinitis due to pollen: Secondary | ICD-10-CM | POA: Diagnosis not present

## 2021-10-10 DIAGNOSIS — J3081 Allergic rhinitis due to animal (cat) (dog) hair and dander: Secondary | ICD-10-CM | POA: Diagnosis not present

## 2021-10-17 DIAGNOSIS — J3081 Allergic rhinitis due to animal (cat) (dog) hair and dander: Secondary | ICD-10-CM | POA: Diagnosis not present

## 2021-10-17 DIAGNOSIS — J301 Allergic rhinitis due to pollen: Secondary | ICD-10-CM | POA: Diagnosis not present

## 2021-10-17 DIAGNOSIS — J3089 Other allergic rhinitis: Secondary | ICD-10-CM | POA: Diagnosis not present

## 2021-10-20 DIAGNOSIS — F410 Panic disorder [episodic paroxysmal anxiety] without agoraphobia: Secondary | ICD-10-CM | POA: Diagnosis not present

## 2021-10-20 DIAGNOSIS — R69 Illness, unspecified: Secondary | ICD-10-CM | POA: Diagnosis not present

## 2021-10-20 DIAGNOSIS — F411 Generalized anxiety disorder: Secondary | ICD-10-CM | POA: Diagnosis not present

## 2021-10-20 DIAGNOSIS — F331 Major depressive disorder, recurrent, moderate: Secondary | ICD-10-CM | POA: Diagnosis not present

## 2021-10-24 DIAGNOSIS — J301 Allergic rhinitis due to pollen: Secondary | ICD-10-CM | POA: Diagnosis not present

## 2021-10-24 DIAGNOSIS — J3081 Allergic rhinitis due to animal (cat) (dog) hair and dander: Secondary | ICD-10-CM | POA: Diagnosis not present

## 2021-10-24 DIAGNOSIS — J3089 Other allergic rhinitis: Secondary | ICD-10-CM | POA: Diagnosis not present

## 2021-10-31 DIAGNOSIS — J3089 Other allergic rhinitis: Secondary | ICD-10-CM | POA: Diagnosis not present

## 2021-10-31 DIAGNOSIS — J3081 Allergic rhinitis due to animal (cat) (dog) hair and dander: Secondary | ICD-10-CM | POA: Diagnosis not present

## 2021-10-31 DIAGNOSIS — J301 Allergic rhinitis due to pollen: Secondary | ICD-10-CM | POA: Diagnosis not present

## 2021-11-07 DIAGNOSIS — J3089 Other allergic rhinitis: Secondary | ICD-10-CM | POA: Diagnosis not present

## 2021-11-07 DIAGNOSIS — J301 Allergic rhinitis due to pollen: Secondary | ICD-10-CM | POA: Diagnosis not present

## 2021-11-07 DIAGNOSIS — J3081 Allergic rhinitis due to animal (cat) (dog) hair and dander: Secondary | ICD-10-CM | POA: Diagnosis not present

## 2021-11-20 DIAGNOSIS — F411 Generalized anxiety disorder: Secondary | ICD-10-CM | POA: Diagnosis not present

## 2021-11-20 DIAGNOSIS — R69 Illness, unspecified: Secondary | ICD-10-CM | POA: Diagnosis not present

## 2021-11-20 DIAGNOSIS — F410 Panic disorder [episodic paroxysmal anxiety] without agoraphobia: Secondary | ICD-10-CM | POA: Diagnosis not present

## 2021-11-20 DIAGNOSIS — F331 Major depressive disorder, recurrent, moderate: Secondary | ICD-10-CM | POA: Diagnosis not present

## 2021-11-21 DIAGNOSIS — J301 Allergic rhinitis due to pollen: Secondary | ICD-10-CM | POA: Diagnosis not present

## 2021-11-21 DIAGNOSIS — J3089 Other allergic rhinitis: Secondary | ICD-10-CM | POA: Diagnosis not present

## 2021-11-21 DIAGNOSIS — J3081 Allergic rhinitis due to animal (cat) (dog) hair and dander: Secondary | ICD-10-CM | POA: Diagnosis not present

## 2021-11-24 ENCOUNTER — Ambulatory Visit: Payer: Federal, State, Local not specified - PPO | Admitting: Family Medicine

## 2021-11-30 DIAGNOSIS — J3089 Other allergic rhinitis: Secondary | ICD-10-CM | POA: Diagnosis not present

## 2021-11-30 DIAGNOSIS — J301 Allergic rhinitis due to pollen: Secondary | ICD-10-CM | POA: Diagnosis not present

## 2021-11-30 DIAGNOSIS — J3081 Allergic rhinitis due to animal (cat) (dog) hair and dander: Secondary | ICD-10-CM | POA: Diagnosis not present

## 2021-12-05 DIAGNOSIS — J3081 Allergic rhinitis due to animal (cat) (dog) hair and dander: Secondary | ICD-10-CM | POA: Diagnosis not present

## 2021-12-05 DIAGNOSIS — J3089 Other allergic rhinitis: Secondary | ICD-10-CM | POA: Diagnosis not present

## 2021-12-05 DIAGNOSIS — J301 Allergic rhinitis due to pollen: Secondary | ICD-10-CM | POA: Diagnosis not present

## 2021-12-12 DIAGNOSIS — J3081 Allergic rhinitis due to animal (cat) (dog) hair and dander: Secondary | ICD-10-CM | POA: Diagnosis not present

## 2021-12-12 DIAGNOSIS — J301 Allergic rhinitis due to pollen: Secondary | ICD-10-CM | POA: Diagnosis not present

## 2021-12-12 DIAGNOSIS — J3089 Other allergic rhinitis: Secondary | ICD-10-CM | POA: Diagnosis not present

## 2021-12-18 DIAGNOSIS — F331 Major depressive disorder, recurrent, moderate: Secondary | ICD-10-CM | POA: Diagnosis not present

## 2021-12-18 DIAGNOSIS — F410 Panic disorder [episodic paroxysmal anxiety] without agoraphobia: Secondary | ICD-10-CM | POA: Diagnosis not present

## 2021-12-18 DIAGNOSIS — F411 Generalized anxiety disorder: Secondary | ICD-10-CM | POA: Diagnosis not present

## 2021-12-18 DIAGNOSIS — R69 Illness, unspecified: Secondary | ICD-10-CM | POA: Diagnosis not present

## 2021-12-26 DIAGNOSIS — J3089 Other allergic rhinitis: Secondary | ICD-10-CM | POA: Diagnosis not present

## 2021-12-26 DIAGNOSIS — J3081 Allergic rhinitis due to animal (cat) (dog) hair and dander: Secondary | ICD-10-CM | POA: Diagnosis not present

## 2021-12-26 DIAGNOSIS — J301 Allergic rhinitis due to pollen: Secondary | ICD-10-CM | POA: Diagnosis not present

## 2021-12-29 DIAGNOSIS — J3089 Other allergic rhinitis: Secondary | ICD-10-CM | POA: Diagnosis not present

## 2021-12-29 DIAGNOSIS — J301 Allergic rhinitis due to pollen: Secondary | ICD-10-CM | POA: Diagnosis not present

## 2021-12-29 DIAGNOSIS — J3081 Allergic rhinitis due to animal (cat) (dog) hair and dander: Secondary | ICD-10-CM | POA: Diagnosis not present

## 2022-01-02 DIAGNOSIS — J301 Allergic rhinitis due to pollen: Secondary | ICD-10-CM | POA: Diagnosis not present

## 2022-01-02 DIAGNOSIS — J3081 Allergic rhinitis due to animal (cat) (dog) hair and dander: Secondary | ICD-10-CM | POA: Diagnosis not present

## 2022-01-02 DIAGNOSIS — J3089 Other allergic rhinitis: Secondary | ICD-10-CM | POA: Diagnosis not present

## 2022-01-09 DIAGNOSIS — J301 Allergic rhinitis due to pollen: Secondary | ICD-10-CM | POA: Diagnosis not present

## 2022-01-09 DIAGNOSIS — J3081 Allergic rhinitis due to animal (cat) (dog) hair and dander: Secondary | ICD-10-CM | POA: Diagnosis not present

## 2022-01-09 DIAGNOSIS — J3089 Other allergic rhinitis: Secondary | ICD-10-CM | POA: Diagnosis not present

## 2022-01-15 DIAGNOSIS — F410 Panic disorder [episodic paroxysmal anxiety] without agoraphobia: Secondary | ICD-10-CM | POA: Diagnosis not present

## 2022-01-15 DIAGNOSIS — F331 Major depressive disorder, recurrent, moderate: Secondary | ICD-10-CM | POA: Diagnosis not present

## 2022-01-15 DIAGNOSIS — R69 Illness, unspecified: Secondary | ICD-10-CM | POA: Diagnosis not present

## 2022-01-15 DIAGNOSIS — F411 Generalized anxiety disorder: Secondary | ICD-10-CM | POA: Diagnosis not present

## 2022-01-16 DIAGNOSIS — J301 Allergic rhinitis due to pollen: Secondary | ICD-10-CM | POA: Diagnosis not present

## 2022-01-16 DIAGNOSIS — J3081 Allergic rhinitis due to animal (cat) (dog) hair and dander: Secondary | ICD-10-CM | POA: Diagnosis not present

## 2022-01-16 DIAGNOSIS — J3089 Other allergic rhinitis: Secondary | ICD-10-CM | POA: Diagnosis not present

## 2022-01-23 DIAGNOSIS — J3081 Allergic rhinitis due to animal (cat) (dog) hair and dander: Secondary | ICD-10-CM | POA: Diagnosis not present

## 2022-01-23 DIAGNOSIS — J3089 Other allergic rhinitis: Secondary | ICD-10-CM | POA: Diagnosis not present

## 2022-01-23 DIAGNOSIS — J301 Allergic rhinitis due to pollen: Secondary | ICD-10-CM | POA: Diagnosis not present

## 2022-01-30 DIAGNOSIS — J3081 Allergic rhinitis due to animal (cat) (dog) hair and dander: Secondary | ICD-10-CM | POA: Diagnosis not present

## 2022-01-30 DIAGNOSIS — J3089 Other allergic rhinitis: Secondary | ICD-10-CM | POA: Diagnosis not present

## 2022-01-30 DIAGNOSIS — J301 Allergic rhinitis due to pollen: Secondary | ICD-10-CM | POA: Diagnosis not present

## 2022-02-08 DIAGNOSIS — J3081 Allergic rhinitis due to animal (cat) (dog) hair and dander: Secondary | ICD-10-CM | POA: Diagnosis not present

## 2022-02-08 DIAGNOSIS — J301 Allergic rhinitis due to pollen: Secondary | ICD-10-CM | POA: Diagnosis not present

## 2022-02-08 DIAGNOSIS — J3089 Other allergic rhinitis: Secondary | ICD-10-CM | POA: Diagnosis not present

## 2022-02-12 DIAGNOSIS — R69 Illness, unspecified: Secondary | ICD-10-CM | POA: Diagnosis not present

## 2022-02-12 DIAGNOSIS — F331 Major depressive disorder, recurrent, moderate: Secondary | ICD-10-CM | POA: Diagnosis not present

## 2022-02-12 DIAGNOSIS — F411 Generalized anxiety disorder: Secondary | ICD-10-CM | POA: Diagnosis not present

## 2022-02-12 DIAGNOSIS — F410 Panic disorder [episodic paroxysmal anxiety] without agoraphobia: Secondary | ICD-10-CM | POA: Diagnosis not present

## 2022-02-13 ENCOUNTER — Encounter: Payer: Self-pay | Admitting: Family Medicine

## 2022-02-13 ENCOUNTER — Ambulatory Visit (INDEPENDENT_AMBULATORY_CARE_PROVIDER_SITE_OTHER): Payer: Medicare HMO | Admitting: Family Medicine

## 2022-02-13 VITALS — BP 116/73 | HR 65 | Temp 98.1°F | Resp 16 | Wt 196.7 lb

## 2022-02-13 DIAGNOSIS — R972 Elevated prostate specific antigen [PSA]: Secondary | ICD-10-CM

## 2022-02-13 DIAGNOSIS — E785 Hyperlipidemia, unspecified: Secondary | ICD-10-CM

## 2022-02-13 DIAGNOSIS — I10 Essential (primary) hypertension: Secondary | ICD-10-CM

## 2022-02-13 DIAGNOSIS — J3089 Other allergic rhinitis: Secondary | ICD-10-CM | POA: Diagnosis not present

## 2022-02-13 DIAGNOSIS — R7303 Prediabetes: Secondary | ICD-10-CM

## 2022-02-13 DIAGNOSIS — J3081 Allergic rhinitis due to animal (cat) (dog) hair and dander: Secondary | ICD-10-CM | POA: Diagnosis not present

## 2022-02-13 DIAGNOSIS — J301 Allergic rhinitis due to pollen: Secondary | ICD-10-CM | POA: Diagnosis not present

## 2022-02-13 NOTE — Patient Instructions (Signed)
Please review the attached list of medications and notify my office if there are any errors.  ° °I recommend that you get the Prevnar 20 vaccine to protect yourself from certain dangerous strains of pneumonia. You can get Prevnar 20 at your pharmacy, or call our office at [phone] at your earliest convenience to schedule this vaccine.   °

## 2022-02-13 NOTE — Progress Notes (Signed)
?  ? ?I,Jana Robinson,acting as a scribe for Mila Merry, MD.,have documented all relevant documentation on the behalf of Mila Merry, MD,as directed by  Mila Merry, MD while in the presence of Mila Merry, MD. ? ? ?Established patient visit ? ? ?Patient: Kurt Edwards   DOB: 19-Jun-1956   65 y.o. Male  MRN: [id number] ?Visit Date: 02/13/2022 ? ?Today's healthcare provider: Mila Merry, MD  ? ?Chief Complaint  ?Patient presents with  ? Hyperlipidemia  ? Hypertension  ? ?Subjective  ?  ?HPI ?HPI   ?Prediabetes  ?Last edited by Ronnell Freshwater, CMA on 02/13/2022  8:20 AM.  ?  ?  ?Lipid/Cholesterol, Follow-up ? ?Last lipid panel Other pertinent labs  ?Lab Results  ?Component Value Date  ? CHOL 232 (H) 09/05/2021  ? HDL 46 09/05/2021  ? LDLCALC 153 (H) 09/05/2021  ? TRIG 180 (H) 09/05/2021  ? CHOLHDL 5.0 09/05/2021  ? Lab Results  ?Component Value Date  ? ALT 33 09/05/2021  ? AST 20 09/05/2021  ? PLT 271 09/05/2021  ? TSH 1.760 09/05/2021  ?  ? ?He was last seen for this 5 months ago.  ?Management since that visit includes start back on rosuvastatin. ?He reports excellent compliance with treatment. ?He is not having side effects.  ? ?Symptoms: ?No chest pain No chest pressure/discomfort  ?No dyspnea No lower extremity edema  ?No numbness or tingling of extremity No orthopnea  ?No palpitations No paroxysmal nocturnal dyspnea  ?No speech difficulty No syncope  ? ?Current diet: in general, an "unhealthy" diet ?Current exercise: none ? ?The 10-year ASCVD risk score (Arnett DK, et al., 2019) is: 14.5% ? ?---------------------------------------------------------------------------------------------------  ?Hypertension, follow-up ? ?BP Readings from Last 3 Encounters:  ?02/13/22 116/73  ?09/05/21 125/79  ?04/25/20 135/81  ? Wt Readings from Last 3 Encounters:  ?02/13/22 196 lb 11.2 oz (89.2 kg)  ?09/05/21 198 lb 6.4 oz (90 kg)  ?04/25/20 186 lb (84.4 kg)  ?  ? ?He was last seen for hypertension 5 months ago.  ?BP at  that visit was . Management since that visit includes no changes. ? ?He reports excellent compliance with treatment. ?He is not having side effects.  ?He is following a Regular diet. ?He is not exercising. ?He does not smoke. ? ?Use of agents associated with hypertension: none.  ? ?Outside blood pressures are: does not do readings  ?Symptoms: ?No chest pain No chest pressure  ?No palpitations No syncope  ?No dyspnea No orthopnea  ?No paroxysmal nocturnal dyspnea No lower extremity edema  ? ? ?---------------------------------------------------------------------------------------------------  ?Follow up for prediabetes ? ?The patient was last seen for this 5 months ago. ?Changes made at last visit include continue lifestyle midifications. Trying to eat some healthier.  ?He reports fair compliance with treatment. ?He feels that condition is Unchanged. ?Lab Results  ?Component Value Date  ? HGBA1C 5.7 (A) 09/05/2021  ?  ?He was also found to have mildly elevated PSA last year and is due to have this rechecked. He denies any urinary symptoms at this time.  ? ?-----------------------------------------------------------------------------------------  ? ?Medications: ?Outpatient Medications Prior to Visit  ?Medication Sig  ? ARIPiprazole (ABILIFY) 5 MG tablet Take 5 mg by mouth daily.  ? buPROPion (WELLBUTRIN XL) 150 MG 24 hr tablet Take 150 mg by mouth every morning.  ? clonazePAM (KLONOPIN) 0.5 MG tablet TAKE 1 DAILY AS NEEDED  ? desvenlafaxine (PRISTIQ) 100 MG 24 hr tablet Take 100 mg by mouth daily.  ? fluticasone (FLONASE) 50 MCG/ACT  nasal spray SPRAY 2 SPRAYS INTO EACH NOSTRIL EVERY DAY  ? losartan (COZAAR) 100 MG tablet TAKE 1 TABLET BY MOUTH EVERY DAY  ? rosuvastatin (CRESTOR) 5 MG tablet Take 1 tablet (5 mg total) by mouth daily.  ? tadalafil (CIALIS) 20 MG tablet TAKE ONE TABLET BY MOUTH ONE HOUR PRIOR TO INTERCOURSE. NO MORE THAN ONE IN A 24 HOURS PERIOD  ? ?No facility-administered medications prior to visit.   ? ? ?Review of Systems ? ? ?  Objective  ?  ?BP 116/73 (BP Location: Right Arm, Patient Position: Sitting, Cuff Size: Normal)   Pulse 65   Temp 98.1 ?F (36.7 ?C) (Oral)   Resp 16   Wt 196 lb 11.2 oz (89.2 kg)   SpO2 100%   BMI 27.43 kg/m?  ? ? ?Physical Exam  ? ?General appearance:  ?Well developed, well nourished male, cooperative and in no acute distress ?Head: Normocephalic, without obvious abnormality, atraumatic ?Respiratory: Respirations even and unlabored, normal respiratory rate ?Extremities: All extremities are intact.  ?Skin: Skin color, texture, turgor normal. No rashes seen  ?Psych: Appropriate mood and affect. ?Neurologic: Mental status: Alert, oriented to person, place, and time, thought content appropriate.  ? Assessment & Plan  ?  ?1. Hyperlipidemia, unspecified hyperlipidemia type ?He is tolerating rosuvastatin well with no adverse effects.   ?- Comprehensive metabolic panel ?- Lipid panel ?- CBC ? ?2. Primary hypertension ?Well controlled.  Continue current medications.   ? ?Noted to have put on some weight since last visit. Encourage regular exercise and health diet.  ? ?3. Elevated prostate specific antigen (PSA) ? ?- PSA ? ?4. Prediabetes ? ?- Hemoglobin A1c  ?   ? ?The entirety of the information documented in the History of Present Illness, Review of Systems and Physical Exam were personally obtained by me. Portions of this information were initially documented by the CMA and reviewed by me for thoroughness and accuracy.   ? ? ?Lelon Huh, MD  ?[facility] ?[phone] (phone) ?[phone] (fax) ? ?[facility] ?

## 2022-02-14 LAB — COMPREHENSIVE METABOLIC PANEL
ALT: 36 IU/L (ref 0–44)
AST: 22 IU/L (ref 0–40)
Albumin/Globulin Ratio: 2 (ref 1.2–2.2)
Albumin: 4.4 g/dL (ref 3.8–4.8)
Alkaline Phosphatase: 96 IU/L (ref 44–121)
BUN/Creatinine Ratio: 12 (ref 10–24)
BUN: 18 mg/dL (ref 8–27)
Bilirubin Total: 0.3 mg/dL (ref 0.0–1.2)
CO2: 25 mmol/L (ref 20–29)
Calcium: 9.4 mg/dL (ref 8.6–10.2)
Chloride: 106 mmol/L (ref 96–106)
Creatinine, Ser: 1.52 mg/dL — ABNORMAL HIGH (ref 0.76–1.27)
Globulin, Total: 2.2 g/dL (ref 1.5–4.5)
Glucose: 105 mg/dL — ABNORMAL HIGH (ref 70–99)
Potassium: 5.2 mmol/L (ref 3.5–5.2)
Sodium: 143 mmol/L (ref 134–144)
Total Protein: 6.6 g/dL (ref 6.0–8.5)
eGFR: 51 mL/min/{1.73_m2} — ABNORMAL LOW (ref >59)

## 2022-02-14 LAB — CBC
Hematocrit: 42.4 % (ref 37.5–51.0)
Hemoglobin: 14.4 g/dL (ref 13.0–17.7)
MCH: 31.6 pg (ref 26.6–33.0)
MCHC: 34 g/dL (ref 31.5–35.7)
MCV: 93 fL (ref 79–97)
Platelets: 255 10*3/uL (ref 150–450)
RBC: 4.56 x10E6/uL (ref 4.14–5.80)
RDW: 12 % (ref 11.6–15.4)
WBC: 5.4 10*3/uL (ref 3.4–10.8)

## 2022-02-14 LAB — LIPID PANEL
Chol/HDL Ratio: 3 ratio (ref 0.0–5.0)
Cholesterol, Total: 149 mg/dL (ref 100–199)
HDL: 49 mg/dL (ref >39)
LDL Chol Calc (NIH): 75 mg/dL (ref 0–99)
Triglycerides: 146 mg/dL (ref 0–149)
VLDL Cholesterol Cal: 25 mg/dL (ref 5–40)

## 2022-02-14 LAB — PSA: Prostate Specific Ag, Serum: 4.6 ng/mL — ABNORMAL HIGH (ref 0.0–4.0)

## 2022-02-14 LAB — HEMOGLOBIN A1C
Est. average glucose Bld gHb Est-mCnc: 123 mg/dL
Hgb A1c MFr Bld: 5.9 % — ABNORMAL HIGH (ref 4.8–5.6)

## 2022-02-22 DIAGNOSIS — J3089 Other allergic rhinitis: Secondary | ICD-10-CM | POA: Diagnosis not present

## 2022-02-22 DIAGNOSIS — J3081 Allergic rhinitis due to animal (cat) (dog) hair and dander: Secondary | ICD-10-CM | POA: Diagnosis not present

## 2022-02-22 DIAGNOSIS — J301 Allergic rhinitis due to pollen: Secondary | ICD-10-CM | POA: Diagnosis not present

## 2022-03-05 DIAGNOSIS — R69 Illness, unspecified: Secondary | ICD-10-CM | POA: Diagnosis not present

## 2022-03-05 DIAGNOSIS — F410 Panic disorder [episodic paroxysmal anxiety] without agoraphobia: Secondary | ICD-10-CM | POA: Diagnosis not present

## 2022-03-05 DIAGNOSIS — F411 Generalized anxiety disorder: Secondary | ICD-10-CM | POA: Diagnosis not present

## 2022-03-05 DIAGNOSIS — F331 Major depressive disorder, recurrent, moderate: Secondary | ICD-10-CM | POA: Diagnosis not present

## 2022-03-06 DIAGNOSIS — J3081 Allergic rhinitis due to animal (cat) (dog) hair and dander: Secondary | ICD-10-CM | POA: Diagnosis not present

## 2022-03-06 DIAGNOSIS — J3089 Other allergic rhinitis: Secondary | ICD-10-CM | POA: Diagnosis not present

## 2022-03-06 DIAGNOSIS — J301 Allergic rhinitis due to pollen: Secondary | ICD-10-CM | POA: Diagnosis not present

## 2022-03-13 DIAGNOSIS — J301 Allergic rhinitis due to pollen: Secondary | ICD-10-CM | POA: Diagnosis not present

## 2022-03-13 DIAGNOSIS — J3089 Other allergic rhinitis: Secondary | ICD-10-CM | POA: Diagnosis not present

## 2022-03-13 DIAGNOSIS — J3081 Allergic rhinitis due to animal (cat) (dog) hair and dander: Secondary | ICD-10-CM | POA: Diagnosis not present

## 2022-03-20 DIAGNOSIS — J301 Allergic rhinitis due to pollen: Secondary | ICD-10-CM | POA: Diagnosis not present

## 2022-03-20 DIAGNOSIS — J3081 Allergic rhinitis due to animal (cat) (dog) hair and dander: Secondary | ICD-10-CM | POA: Diagnosis not present

## 2022-03-20 DIAGNOSIS — J3089 Other allergic rhinitis: Secondary | ICD-10-CM | POA: Diagnosis not present

## 2022-03-27 DIAGNOSIS — J3081 Allergic rhinitis due to animal (cat) (dog) hair and dander: Secondary | ICD-10-CM | POA: Diagnosis not present

## 2022-03-27 DIAGNOSIS — J3089 Other allergic rhinitis: Secondary | ICD-10-CM | POA: Diagnosis not present

## 2022-03-27 DIAGNOSIS — J301 Allergic rhinitis due to pollen: Secondary | ICD-10-CM | POA: Diagnosis not present

## 2022-04-03 DIAGNOSIS — J301 Allergic rhinitis due to pollen: Secondary | ICD-10-CM | POA: Diagnosis not present

## 2022-04-03 DIAGNOSIS — J3081 Allergic rhinitis due to animal (cat) (dog) hair and dander: Secondary | ICD-10-CM | POA: Diagnosis not present

## 2022-04-03 DIAGNOSIS — J3089 Other allergic rhinitis: Secondary | ICD-10-CM | POA: Diagnosis not present

## 2022-04-06 ENCOUNTER — Other Ambulatory Visit: Payer: Self-pay | Admitting: Family Medicine

## 2022-04-09 DIAGNOSIS — R69 Illness, unspecified: Secondary | ICD-10-CM | POA: Diagnosis not present

## 2022-04-09 DIAGNOSIS — F411 Generalized anxiety disorder: Secondary | ICD-10-CM | POA: Diagnosis not present

## 2022-04-09 DIAGNOSIS — F410 Panic disorder [episodic paroxysmal anxiety] without agoraphobia: Secondary | ICD-10-CM | POA: Diagnosis not present

## 2022-04-09 DIAGNOSIS — F331 Major depressive disorder, recurrent, moderate: Secondary | ICD-10-CM | POA: Diagnosis not present

## 2022-04-12 DIAGNOSIS — J3089 Other allergic rhinitis: Secondary | ICD-10-CM | POA: Diagnosis not present

## 2022-04-12 DIAGNOSIS — J3081 Allergic rhinitis due to animal (cat) (dog) hair and dander: Secondary | ICD-10-CM | POA: Diagnosis not present

## 2022-04-12 DIAGNOSIS — J301 Allergic rhinitis due to pollen: Secondary | ICD-10-CM | POA: Diagnosis not present

## 2022-04-19 DIAGNOSIS — J301 Allergic rhinitis due to pollen: Secondary | ICD-10-CM | POA: Diagnosis not present

## 2022-04-19 DIAGNOSIS — J3089 Other allergic rhinitis: Secondary | ICD-10-CM | POA: Diagnosis not present

## 2022-04-19 DIAGNOSIS — J3081 Allergic rhinitis due to animal (cat) (dog) hair and dander: Secondary | ICD-10-CM | POA: Diagnosis not present

## 2022-04-26 DIAGNOSIS — J301 Allergic rhinitis due to pollen: Secondary | ICD-10-CM | POA: Diagnosis not present

## 2022-04-26 DIAGNOSIS — J3089 Other allergic rhinitis: Secondary | ICD-10-CM | POA: Diagnosis not present

## 2022-04-26 DIAGNOSIS — J3081 Allergic rhinitis due to animal (cat) (dog) hair and dander: Secondary | ICD-10-CM | POA: Diagnosis not present

## 2022-05-01 DIAGNOSIS — J301 Allergic rhinitis due to pollen: Secondary | ICD-10-CM | POA: Diagnosis not present

## 2022-05-01 DIAGNOSIS — J3089 Other allergic rhinitis: Secondary | ICD-10-CM | POA: Diagnosis not present

## 2022-05-01 DIAGNOSIS — J3081 Allergic rhinitis due to animal (cat) (dog) hair and dander: Secondary | ICD-10-CM | POA: Diagnosis not present

## 2022-05-08 DIAGNOSIS — J3081 Allergic rhinitis due to animal (cat) (dog) hair and dander: Secondary | ICD-10-CM | POA: Diagnosis not present

## 2022-05-08 DIAGNOSIS — J301 Allergic rhinitis due to pollen: Secondary | ICD-10-CM | POA: Diagnosis not present

## 2022-05-08 DIAGNOSIS — J3089 Other allergic rhinitis: Secondary | ICD-10-CM | POA: Diagnosis not present

## 2022-05-11 ENCOUNTER — Other Ambulatory Visit: Payer: Self-pay | Admitting: Family Medicine

## 2022-05-14 ENCOUNTER — Telehealth: Payer: Self-pay | Admitting: Family Medicine

## 2022-05-14 DIAGNOSIS — F410 Panic disorder [episodic paroxysmal anxiety] without agoraphobia: Secondary | ICD-10-CM | POA: Diagnosis not present

## 2022-05-14 DIAGNOSIS — F331 Major depressive disorder, recurrent, moderate: Secondary | ICD-10-CM | POA: Diagnosis not present

## 2022-05-14 DIAGNOSIS — R69 Illness, unspecified: Secondary | ICD-10-CM | POA: Diagnosis not present

## 2022-05-14 DIAGNOSIS — F411 Generalized anxiety disorder: Secondary | ICD-10-CM | POA: Diagnosis not present

## 2022-05-14 NOTE — Telephone Encounter (Signed)
Copied from CRM [id number]. Topic: Medicare AWV >> May 14, 2022  9:40 AM Zannie Kehr wrote: Reason for CRM:  Phone answers but no one speaks   unable to leave a message patient to call back and schedule Medicare Annual Wellness Visit (AWV) in office.   If not able to come in the office, please offer to do virtually or by telephone.   No hx of AWV - AWV-I eligible per [employer] as of 04/14/2022  Please schedule at anytime with [facility] Health Advisor.   45 minute appointment  Any questions, please contact me at [phone]

## 2022-05-15 DIAGNOSIS — J3081 Allergic rhinitis due to animal (cat) (dog) hair and dander: Secondary | ICD-10-CM | POA: Diagnosis not present

## 2022-05-15 DIAGNOSIS — J3089 Other allergic rhinitis: Secondary | ICD-10-CM | POA: Diagnosis not present

## 2022-05-15 DIAGNOSIS — J301 Allergic rhinitis due to pollen: Secondary | ICD-10-CM | POA: Diagnosis not present

## 2022-05-24 DIAGNOSIS — J3089 Other allergic rhinitis: Secondary | ICD-10-CM | POA: Diagnosis not present

## 2022-05-24 DIAGNOSIS — J301 Allergic rhinitis due to pollen: Secondary | ICD-10-CM | POA: Diagnosis not present

## 2022-05-24 DIAGNOSIS — J3081 Allergic rhinitis due to animal (cat) (dog) hair and dander: Secondary | ICD-10-CM | POA: Diagnosis not present

## 2022-05-28 ENCOUNTER — Telehealth: Payer: Self-pay | Admitting: Family Medicine

## 2022-05-28 NOTE — Telephone Encounter (Signed)
Patient declined the Medicare Wellness Visit with [facility]  

## 2022-05-29 DIAGNOSIS — J301 Allergic rhinitis due to pollen: Secondary | ICD-10-CM | POA: Diagnosis not present

## 2022-05-29 DIAGNOSIS — J3089 Other allergic rhinitis: Secondary | ICD-10-CM | POA: Diagnosis not present

## 2022-05-29 DIAGNOSIS — J3081 Allergic rhinitis due to animal (cat) (dog) hair and dander: Secondary | ICD-10-CM | POA: Diagnosis not present

## 2022-05-31 DIAGNOSIS — J3089 Other allergic rhinitis: Secondary | ICD-10-CM | POA: Diagnosis not present

## 2022-05-31 DIAGNOSIS — J301 Allergic rhinitis due to pollen: Secondary | ICD-10-CM | POA: Diagnosis not present

## 2022-05-31 DIAGNOSIS — J3081 Allergic rhinitis due to animal (cat) (dog) hair and dander: Secondary | ICD-10-CM | POA: Diagnosis not present

## 2022-06-05 DIAGNOSIS — J3089 Other allergic rhinitis: Secondary | ICD-10-CM | POA: Diagnosis not present

## 2022-06-05 DIAGNOSIS — J301 Allergic rhinitis due to pollen: Secondary | ICD-10-CM | POA: Diagnosis not present

## 2022-06-05 DIAGNOSIS — J3081 Allergic rhinitis due to animal (cat) (dog) hair and dander: Secondary | ICD-10-CM | POA: Diagnosis not present

## 2022-06-11 DIAGNOSIS — F411 Generalized anxiety disorder: Secondary | ICD-10-CM | POA: Diagnosis not present

## 2022-06-11 DIAGNOSIS — R69 Illness, unspecified: Secondary | ICD-10-CM | POA: Diagnosis not present

## 2022-06-11 DIAGNOSIS — F410 Panic disorder [episodic paroxysmal anxiety] without agoraphobia: Secondary | ICD-10-CM | POA: Diagnosis not present

## 2022-06-11 DIAGNOSIS — F331 Major depressive disorder, recurrent, moderate: Secondary | ICD-10-CM | POA: Diagnosis not present

## 2022-06-12 DIAGNOSIS — J3081 Allergic rhinitis due to animal (cat) (dog) hair and dander: Secondary | ICD-10-CM | POA: Diagnosis not present

## 2022-06-12 DIAGNOSIS — J3089 Other allergic rhinitis: Secondary | ICD-10-CM | POA: Diagnosis not present

## 2022-06-12 DIAGNOSIS — J301 Allergic rhinitis due to pollen: Secondary | ICD-10-CM | POA: Diagnosis not present

## 2022-06-19 DIAGNOSIS — J301 Allergic rhinitis due to pollen: Secondary | ICD-10-CM | POA: Diagnosis not present

## 2022-06-19 DIAGNOSIS — J3089 Other allergic rhinitis: Secondary | ICD-10-CM | POA: Diagnosis not present

## 2022-06-19 DIAGNOSIS — J3081 Allergic rhinitis due to animal (cat) (dog) hair and dander: Secondary | ICD-10-CM | POA: Diagnosis not present

## 2022-06-26 DIAGNOSIS — J3081 Allergic rhinitis due to animal (cat) (dog) hair and dander: Secondary | ICD-10-CM | POA: Diagnosis not present

## 2022-06-26 DIAGNOSIS — J301 Allergic rhinitis due to pollen: Secondary | ICD-10-CM | POA: Diagnosis not present

## 2022-06-26 DIAGNOSIS — J3089 Other allergic rhinitis: Secondary | ICD-10-CM | POA: Diagnosis not present

## 2022-07-01 ENCOUNTER — Other Ambulatory Visit: Payer: Self-pay | Admitting: Family Medicine

## 2022-07-01 DIAGNOSIS — J301 Allergic rhinitis due to pollen: Secondary | ICD-10-CM

## 2022-07-05 DIAGNOSIS — J3089 Other allergic rhinitis: Secondary | ICD-10-CM | POA: Diagnosis not present

## 2022-07-05 DIAGNOSIS — J301 Allergic rhinitis due to pollen: Secondary | ICD-10-CM | POA: Diagnosis not present

## 2022-07-05 DIAGNOSIS — J3081 Allergic rhinitis due to animal (cat) (dog) hair and dander: Secondary | ICD-10-CM | POA: Diagnosis not present

## 2022-07-10 DIAGNOSIS — J3089 Other allergic rhinitis: Secondary | ICD-10-CM | POA: Diagnosis not present

## 2022-07-10 DIAGNOSIS — J301 Allergic rhinitis due to pollen: Secondary | ICD-10-CM | POA: Diagnosis not present

## 2022-07-10 DIAGNOSIS — J3081 Allergic rhinitis due to animal (cat) (dog) hair and dander: Secondary | ICD-10-CM | POA: Diagnosis not present

## 2022-07-11 DIAGNOSIS — F411 Generalized anxiety disorder: Secondary | ICD-10-CM | POA: Diagnosis not present

## 2022-07-11 DIAGNOSIS — R69 Illness, unspecified: Secondary | ICD-10-CM | POA: Diagnosis not present

## 2022-07-11 DIAGNOSIS — F331 Major depressive disorder, recurrent, moderate: Secondary | ICD-10-CM | POA: Diagnosis not present

## 2022-07-11 DIAGNOSIS — F410 Panic disorder [episodic paroxysmal anxiety] without agoraphobia: Secondary | ICD-10-CM | POA: Diagnosis not present

## 2022-07-17 DIAGNOSIS — J3089 Other allergic rhinitis: Secondary | ICD-10-CM | POA: Diagnosis not present

## 2022-07-17 DIAGNOSIS — J3081 Allergic rhinitis due to animal (cat) (dog) hair and dander: Secondary | ICD-10-CM | POA: Diagnosis not present

## 2022-07-17 DIAGNOSIS — J301 Allergic rhinitis due to pollen: Secondary | ICD-10-CM | POA: Diagnosis not present

## 2022-07-31 DIAGNOSIS — J301 Allergic rhinitis due to pollen: Secondary | ICD-10-CM | POA: Diagnosis not present

## 2022-07-31 DIAGNOSIS — J3081 Allergic rhinitis due to animal (cat) (dog) hair and dander: Secondary | ICD-10-CM | POA: Diagnosis not present

## 2022-07-31 DIAGNOSIS — J3089 Other allergic rhinitis: Secondary | ICD-10-CM | POA: Diagnosis not present

## 2022-08-14 DIAGNOSIS — J3081 Allergic rhinitis due to animal (cat) (dog) hair and dander: Secondary | ICD-10-CM | POA: Diagnosis not present

## 2022-08-14 DIAGNOSIS — H1045 Other chronic allergic conjunctivitis: Secondary | ICD-10-CM | POA: Diagnosis not present

## 2022-08-14 DIAGNOSIS — J3089 Other allergic rhinitis: Secondary | ICD-10-CM | POA: Diagnosis not present

## 2022-08-14 DIAGNOSIS — J301 Allergic rhinitis due to pollen: Secondary | ICD-10-CM | POA: Diagnosis not present

## 2022-08-20 DIAGNOSIS — F410 Panic disorder [episodic paroxysmal anxiety] without agoraphobia: Secondary | ICD-10-CM | POA: Diagnosis not present

## 2022-08-20 DIAGNOSIS — F331 Major depressive disorder, recurrent, moderate: Secondary | ICD-10-CM | POA: Diagnosis not present

## 2022-08-20 DIAGNOSIS — F411 Generalized anxiety disorder: Secondary | ICD-10-CM | POA: Diagnosis not present

## 2022-08-20 DIAGNOSIS — R69 Illness, unspecified: Secondary | ICD-10-CM | POA: Diagnosis not present

## 2022-08-21 DIAGNOSIS — J3089 Other allergic rhinitis: Secondary | ICD-10-CM | POA: Diagnosis not present

## 2022-08-21 DIAGNOSIS — J301 Allergic rhinitis due to pollen: Secondary | ICD-10-CM | POA: Diagnosis not present

## 2022-08-21 DIAGNOSIS — J3081 Allergic rhinitis due to animal (cat) (dog) hair and dander: Secondary | ICD-10-CM | POA: Diagnosis not present

## 2022-08-28 DIAGNOSIS — J3081 Allergic rhinitis due to animal (cat) (dog) hair and dander: Secondary | ICD-10-CM | POA: Diagnosis not present

## 2022-08-28 DIAGNOSIS — J3089 Other allergic rhinitis: Secondary | ICD-10-CM | POA: Diagnosis not present

## 2022-08-28 DIAGNOSIS — J301 Allergic rhinitis due to pollen: Secondary | ICD-10-CM | POA: Diagnosis not present

## 2022-09-04 DIAGNOSIS — J3081 Allergic rhinitis due to animal (cat) (dog) hair and dander: Secondary | ICD-10-CM | POA: Diagnosis not present

## 2022-09-04 DIAGNOSIS — J301 Allergic rhinitis due to pollen: Secondary | ICD-10-CM | POA: Diagnosis not present

## 2022-09-04 DIAGNOSIS — J3089 Other allergic rhinitis: Secondary | ICD-10-CM | POA: Diagnosis not present

## 2022-09-11 DIAGNOSIS — J3081 Allergic rhinitis due to animal (cat) (dog) hair and dander: Secondary | ICD-10-CM | POA: Diagnosis not present

## 2022-09-11 DIAGNOSIS — J301 Allergic rhinitis due to pollen: Secondary | ICD-10-CM | POA: Diagnosis not present

## 2022-09-11 DIAGNOSIS — J3089 Other allergic rhinitis: Secondary | ICD-10-CM | POA: Diagnosis not present

## 2022-09-17 DIAGNOSIS — R69 Illness, unspecified: Secondary | ICD-10-CM | POA: Diagnosis not present

## 2022-09-17 DIAGNOSIS — F410 Panic disorder [episodic paroxysmal anxiety] without agoraphobia: Secondary | ICD-10-CM | POA: Diagnosis not present

## 2022-09-17 DIAGNOSIS — F331 Major depressive disorder, recurrent, moderate: Secondary | ICD-10-CM | POA: Diagnosis not present

## 2022-09-17 DIAGNOSIS — F411 Generalized anxiety disorder: Secondary | ICD-10-CM | POA: Diagnosis not present

## 2022-09-18 DIAGNOSIS — J301 Allergic rhinitis due to pollen: Secondary | ICD-10-CM | POA: Diagnosis not present

## 2022-09-18 DIAGNOSIS — J3089 Other allergic rhinitis: Secondary | ICD-10-CM | POA: Diagnosis not present

## 2022-09-18 DIAGNOSIS — J3081 Allergic rhinitis due to animal (cat) (dog) hair and dander: Secondary | ICD-10-CM | POA: Diagnosis not present

## 2022-09-19 ENCOUNTER — Ambulatory Visit (INDEPENDENT_AMBULATORY_CARE_PROVIDER_SITE_OTHER): Payer: Medicare HMO | Admitting: Urology

## 2022-09-19 ENCOUNTER — Encounter: Payer: Self-pay | Admitting: Urology

## 2022-09-19 VITALS — BP 129/85 | HR 73 | Ht 72.0 in | Wt 190.0 lb

## 2022-09-19 DIAGNOSIS — R972 Elevated prostate specific antigen [PSA]: Secondary | ICD-10-CM | POA: Diagnosis not present

## 2022-09-19 DIAGNOSIS — N5201 Erectile dysfunction due to arterial insufficiency: Secondary | ICD-10-CM

## 2022-09-19 MED ORDER — SILDENAFIL CITRATE 100 MG PO TABS: ORAL_TABLET | ORAL | 0 refills | Status: AC

## 2022-09-19 NOTE — Progress Notes (Signed)
09/19/2022 9:03 AM   Kurt Edwards 14-Jun-1956 [id number]  Referring provider: Malva Limes, MD [address] [place],  [place] [address]  Chief Complaint  Patient presents with   Elevated PSA    HPI: 66 y.o. male presents for follow-up of erectile dysfunction.  Initially seen 04/25/2020 for elevated PSA and erectile dysfunction PSA 04/07/2020 was 4.1 (4.6 02/22/2020).  He elected surveillance after discussing management options. Repeat PSA 09/05/2021 was 4.7 and 4.6 on 02/13/2022 Was prescribed tadalafil 20 mg for ED which was initially effective however recently he has noted significant decrease in efficacy Organic risk factors hypertension, hypercholesterolemia and antihypertensive medications   PMH: Past Medical History:  Diagnosis Date   Anxiety    Hypercholesteremia    Hypertension     Surgical History: Past Surgical History:  Procedure Laterality Date   CHOLECYSTECTOMY     NASAL SEPTUM SURGERY     OTHER SURGICAL HISTORY     arm surgery, right, fracture, 2009   PALATE / UVULA BIOPSY / EXCISION     TONSILLECTOMY     UPPER GI ENDOSCOPY  09/27/11   H.Pylori negative, reactive gastropathy, no Barretts.    Home Medications:  Allergies as of 09/19/2022       Reactions   Amlodipine Besylate Hives   Possible reaction to amlodipine   Atorvastatin    Other reaction(s): Muscle Pain        Medication List        Accurate as of September 19, 2022  9:03 AM. If you have any questions, ask your nurse or doctor.          ARIPiprazole 5 MG tablet Commonly known as: ABILIFY Take 5 mg by mouth daily.   buPROPion 150 MG 24 hr tablet Commonly known as: WELLBUTRIN XL Take 150 mg by mouth every morning.   clonazePAM 0.5 MG tablet Commonly known as: KLONOPIN TAKE 1 DAILY AS NEEDED   desvenlafaxine 100 MG 24 hr tablet Commonly known as: PRISTIQ Take 100 mg by mouth daily.   fluticasone 50 MCG/ACT nasal spray Commonly known as: FLONASE SPRAY 2  SPRAYS INTO EACH NOSTRIL EVERY DAY   lamoTRIgine 200 MG tablet Commonly known as: LAMICTAL Take 200 mg by mouth daily.   losartan 100 MG tablet Commonly known as: COZAAR TAKE 1 TABLET BY MOUTH EVERY DAY   rosuvastatin 5 MG tablet Commonly known as: CRESTOR TAKE 1 TABLET (5 MG TOTAL) BY MOUTH DAILY.   tadalafil 20 MG tablet Commonly known as: CIALIS TAKE ONE TABLET BY MOUTH ONE HOUR PRIOR TO INTERCOURSE. NO MORE THAN ONE IN A 24 HOURS PERIOD        Allergies:  Allergies  Allergen Reactions   Amlodipine Besylate Hives    Possible reaction to amlodipine   Atorvastatin     Other reaction(s): Muscle Pain    Family History: Family History  Problem Relation Age of Onset   Hypertension Mother    Emphysema Father     Social History:  reports that he has never smoked. He has never used smokeless tobacco. He reports that he does not drink alcohol and does not use drugs.   Physical Exam: BP 129/85   Pulse 73   Ht 6' (1.829 m)   Wt 190 lb (86.2 kg)   BMI 25.77 kg/m   Constitutional:  Alert and oriented, No acute distress. HEENT: [place] AT Respiratory: Normal respiratory effort, no increased work of breathing. GU: Prostate 40 g, smooth without nodules Skin: No  rashes, bruises or suspicious lesions. Psychiatric: Normal mood and affect.    Assessment & Plan:    1.  Erectile dysfunction Second line options were discussed including intracavernosal injections and vacuum erection devices.  He was provided literature on each. He has not tried sildenafil.  We discussed the efficacy of PDE 5 inhibitors are similar.  He did desire trial of sildenafil initially and Rx 100 mg sent to pharmacy  2.  Elevated PSA Mild PSA patient-stable Benign DRE He desires to continue annual PSA with his PCP  Follow-up 1 year  Riki Altes, MD  [facility] [address] [place], [place] [address] [phone]

## 2022-09-21 DIAGNOSIS — H2511 Age-related nuclear cataract, right eye: Secondary | ICD-10-CM | POA: Diagnosis not present

## 2022-09-25 DIAGNOSIS — J3081 Allergic rhinitis due to animal (cat) (dog) hair and dander: Secondary | ICD-10-CM | POA: Diagnosis not present

## 2022-09-25 DIAGNOSIS — J301 Allergic rhinitis due to pollen: Secondary | ICD-10-CM | POA: Diagnosis not present

## 2022-09-25 DIAGNOSIS — J3089 Other allergic rhinitis: Secondary | ICD-10-CM | POA: Diagnosis not present

## 2022-10-02 DIAGNOSIS — J301 Allergic rhinitis due to pollen: Secondary | ICD-10-CM | POA: Diagnosis not present

## 2022-10-02 DIAGNOSIS — J3081 Allergic rhinitis due to animal (cat) (dog) hair and dander: Secondary | ICD-10-CM | POA: Diagnosis not present

## 2022-10-02 DIAGNOSIS — J3089 Other allergic rhinitis: Secondary | ICD-10-CM | POA: Diagnosis not present

## 2022-10-09 ENCOUNTER — Telehealth: Payer: Self-pay | Admitting: Family Medicine

## 2022-10-09 NOTE — Telephone Encounter (Signed)
Left message for patient to call back and schedule Medicare Annual Wellness Visit (AWV) in office.   If not able to come in office, please offer to do virtually or by telephone.  Left office number and my jabber #[phone].  AWVI eligible as of:04/14/2022   Please schedule at anytime with Nurse Health Advisor.  

## 2022-10-11 DIAGNOSIS — J301 Allergic rhinitis due to pollen: Secondary | ICD-10-CM | POA: Diagnosis not present

## 2022-10-11 DIAGNOSIS — J3081 Allergic rhinitis due to animal (cat) (dog) hair and dander: Secondary | ICD-10-CM | POA: Diagnosis not present

## 2022-10-11 DIAGNOSIS — J3089 Other allergic rhinitis: Secondary | ICD-10-CM | POA: Diagnosis not present

## 2022-10-16 DIAGNOSIS — J3089 Other allergic rhinitis: Secondary | ICD-10-CM | POA: Diagnosis not present

## 2022-10-16 DIAGNOSIS — J3081 Allergic rhinitis due to animal (cat) (dog) hair and dander: Secondary | ICD-10-CM | POA: Diagnosis not present

## 2022-10-16 DIAGNOSIS — J301 Allergic rhinitis due to pollen: Secondary | ICD-10-CM | POA: Diagnosis not present

## 2022-10-19 DIAGNOSIS — H2511 Age-related nuclear cataract, right eye: Secondary | ICD-10-CM | POA: Diagnosis not present

## 2022-10-19 DIAGNOSIS — H269 Unspecified cataract: Secondary | ICD-10-CM | POA: Diagnosis not present

## 2022-10-22 DIAGNOSIS — F411 Generalized anxiety disorder: Secondary | ICD-10-CM | POA: Diagnosis not present

## 2022-10-22 DIAGNOSIS — F331 Major depressive disorder, recurrent, moderate: Secondary | ICD-10-CM | POA: Diagnosis not present

## 2022-10-22 DIAGNOSIS — R69 Illness, unspecified: Secondary | ICD-10-CM | POA: Diagnosis not present

## 2022-10-22 DIAGNOSIS — F410 Panic disorder [episodic paroxysmal anxiety] without agoraphobia: Secondary | ICD-10-CM | POA: Diagnosis not present

## 2022-10-25 DIAGNOSIS — J301 Allergic rhinitis due to pollen: Secondary | ICD-10-CM | POA: Diagnosis not present

## 2022-10-25 DIAGNOSIS — J3089 Other allergic rhinitis: Secondary | ICD-10-CM | POA: Diagnosis not present

## 2022-10-25 DIAGNOSIS — J3081 Allergic rhinitis due to animal (cat) (dog) hair and dander: Secondary | ICD-10-CM | POA: Diagnosis not present

## 2022-10-26 DIAGNOSIS — H2511 Age-related nuclear cataract, right eye: Secondary | ICD-10-CM | POA: Diagnosis not present

## 2022-10-30 DIAGNOSIS — J301 Allergic rhinitis due to pollen: Secondary | ICD-10-CM | POA: Diagnosis not present

## 2022-10-30 DIAGNOSIS — J3089 Other allergic rhinitis: Secondary | ICD-10-CM | POA: Diagnosis not present

## 2022-10-30 DIAGNOSIS — J3081 Allergic rhinitis due to animal (cat) (dog) hair and dander: Secondary | ICD-10-CM | POA: Diagnosis not present

## 2022-11-08 DIAGNOSIS — J3089 Other allergic rhinitis: Secondary | ICD-10-CM | POA: Diagnosis not present

## 2022-11-08 DIAGNOSIS — J301 Allergic rhinitis due to pollen: Secondary | ICD-10-CM | POA: Diagnosis not present

## 2022-11-08 DIAGNOSIS — J3081 Allergic rhinitis due to animal (cat) (dog) hair and dander: Secondary | ICD-10-CM | POA: Diagnosis not present

## 2022-11-26 DIAGNOSIS — F410 Panic disorder [episodic paroxysmal anxiety] without agoraphobia: Secondary | ICD-10-CM | POA: Diagnosis not present

## 2022-11-26 DIAGNOSIS — R69 Illness, unspecified: Secondary | ICD-10-CM | POA: Diagnosis not present

## 2022-11-26 DIAGNOSIS — F331 Major depressive disorder, recurrent, moderate: Secondary | ICD-10-CM | POA: Diagnosis not present

## 2022-11-26 DIAGNOSIS — F411 Generalized anxiety disorder: Secondary | ICD-10-CM | POA: Diagnosis not present

## 2022-11-29 DIAGNOSIS — J3081 Allergic rhinitis due to animal (cat) (dog) hair and dander: Secondary | ICD-10-CM | POA: Diagnosis not present

## 2022-11-29 DIAGNOSIS — J301 Allergic rhinitis due to pollen: Secondary | ICD-10-CM | POA: Diagnosis not present

## 2022-11-29 DIAGNOSIS — J3089 Other allergic rhinitis: Secondary | ICD-10-CM | POA: Diagnosis not present

## 2022-12-03 DIAGNOSIS — H43812 Vitreous degeneration, left eye: Secondary | ICD-10-CM | POA: Diagnosis not present

## 2022-12-06 DIAGNOSIS — J3081 Allergic rhinitis due to animal (cat) (dog) hair and dander: Secondary | ICD-10-CM | POA: Diagnosis not present

## 2022-12-06 DIAGNOSIS — J3089 Other allergic rhinitis: Secondary | ICD-10-CM | POA: Diagnosis not present

## 2022-12-06 DIAGNOSIS — J301 Allergic rhinitis due to pollen: Secondary | ICD-10-CM | POA: Diagnosis not present

## 2022-12-13 DIAGNOSIS — J3089 Other allergic rhinitis: Secondary | ICD-10-CM | POA: Diagnosis not present

## 2022-12-13 DIAGNOSIS — J301 Allergic rhinitis due to pollen: Secondary | ICD-10-CM | POA: Diagnosis not present

## 2022-12-13 DIAGNOSIS — J3081 Allergic rhinitis due to animal (cat) (dog) hair and dander: Secondary | ICD-10-CM | POA: Diagnosis not present

## 2022-12-18 DIAGNOSIS — J3081 Allergic rhinitis due to animal (cat) (dog) hair and dander: Secondary | ICD-10-CM | POA: Diagnosis not present

## 2022-12-18 DIAGNOSIS — J3089 Other allergic rhinitis: Secondary | ICD-10-CM | POA: Diagnosis not present

## 2022-12-18 DIAGNOSIS — J301 Allergic rhinitis due to pollen: Secondary | ICD-10-CM | POA: Diagnosis not present

## 2022-12-24 DIAGNOSIS — F411 Generalized anxiety disorder: Secondary | ICD-10-CM | POA: Diagnosis not present

## 2022-12-24 DIAGNOSIS — F331 Major depressive disorder, recurrent, moderate: Secondary | ICD-10-CM | POA: Diagnosis not present

## 2022-12-24 DIAGNOSIS — F410 Panic disorder [episodic paroxysmal anxiety] without agoraphobia: Secondary | ICD-10-CM | POA: Diagnosis not present

## 2022-12-24 DIAGNOSIS — R69 Illness, unspecified: Secondary | ICD-10-CM | POA: Diagnosis not present

## 2022-12-27 DIAGNOSIS — J3081 Allergic rhinitis due to animal (cat) (dog) hair and dander: Secondary | ICD-10-CM | POA: Diagnosis not present

## 2022-12-27 DIAGNOSIS — J3089 Other allergic rhinitis: Secondary | ICD-10-CM | POA: Diagnosis not present

## 2022-12-27 DIAGNOSIS — J301 Allergic rhinitis due to pollen: Secondary | ICD-10-CM | POA: Diagnosis not present

## 2023-01-01 DIAGNOSIS — J3089 Other allergic rhinitis: Secondary | ICD-10-CM | POA: Diagnosis not present

## 2023-01-01 DIAGNOSIS — J3081 Allergic rhinitis due to animal (cat) (dog) hair and dander: Secondary | ICD-10-CM | POA: Diagnosis not present

## 2023-01-01 DIAGNOSIS — J301 Allergic rhinitis due to pollen: Secondary | ICD-10-CM | POA: Diagnosis not present

## 2023-01-10 DIAGNOSIS — J3081 Allergic rhinitis due to animal (cat) (dog) hair and dander: Secondary | ICD-10-CM | POA: Diagnosis not present

## 2023-01-10 DIAGNOSIS — J3089 Other allergic rhinitis: Secondary | ICD-10-CM | POA: Diagnosis not present

## 2023-01-10 DIAGNOSIS — J301 Allergic rhinitis due to pollen: Secondary | ICD-10-CM | POA: Diagnosis not present

## 2023-01-15 DIAGNOSIS — J3081 Allergic rhinitis due to animal (cat) (dog) hair and dander: Secondary | ICD-10-CM | POA: Diagnosis not present

## 2023-01-15 DIAGNOSIS — J3089 Other allergic rhinitis: Secondary | ICD-10-CM | POA: Diagnosis not present

## 2023-01-15 DIAGNOSIS — J301 Allergic rhinitis due to pollen: Secondary | ICD-10-CM | POA: Diagnosis not present

## 2023-01-22 DIAGNOSIS — J301 Allergic rhinitis due to pollen: Secondary | ICD-10-CM | POA: Diagnosis not present

## 2023-01-22 DIAGNOSIS — J3089 Other allergic rhinitis: Secondary | ICD-10-CM | POA: Diagnosis not present

## 2023-01-22 DIAGNOSIS — J3081 Allergic rhinitis due to animal (cat) (dog) hair and dander: Secondary | ICD-10-CM | POA: Diagnosis not present

## 2023-01-29 DIAGNOSIS — J301 Allergic rhinitis due to pollen: Secondary | ICD-10-CM | POA: Diagnosis not present

## 2023-01-29 DIAGNOSIS — J3081 Allergic rhinitis due to animal (cat) (dog) hair and dander: Secondary | ICD-10-CM | POA: Diagnosis not present

## 2023-01-29 DIAGNOSIS — J3089 Other allergic rhinitis: Secondary | ICD-10-CM | POA: Diagnosis not present

## 2023-02-05 DIAGNOSIS — J301 Allergic rhinitis due to pollen: Secondary | ICD-10-CM | POA: Diagnosis not present

## 2023-02-05 DIAGNOSIS — J3081 Allergic rhinitis due to animal (cat) (dog) hair and dander: Secondary | ICD-10-CM | POA: Diagnosis not present

## 2023-02-05 DIAGNOSIS — J3089 Other allergic rhinitis: Secondary | ICD-10-CM | POA: Diagnosis not present

## 2023-02-11 DIAGNOSIS — F410 Panic disorder [episodic paroxysmal anxiety] without agoraphobia: Secondary | ICD-10-CM | POA: Diagnosis not present

## 2023-02-11 DIAGNOSIS — F331 Major depressive disorder, recurrent, moderate: Secondary | ICD-10-CM | POA: Diagnosis not present

## 2023-02-11 DIAGNOSIS — F411 Generalized anxiety disorder: Secondary | ICD-10-CM | POA: Diagnosis not present

## 2023-02-12 DIAGNOSIS — J3089 Other allergic rhinitis: Secondary | ICD-10-CM | POA: Diagnosis not present

## 2023-02-12 DIAGNOSIS — J301 Allergic rhinitis due to pollen: Secondary | ICD-10-CM | POA: Diagnosis not present

## 2023-02-12 DIAGNOSIS — J3081 Allergic rhinitis due to animal (cat) (dog) hair and dander: Secondary | ICD-10-CM | POA: Diagnosis not present

## 2023-02-15 ENCOUNTER — Other Ambulatory Visit: Payer: Self-pay | Admitting: Family Medicine

## 2023-02-20 DIAGNOSIS — J3089 Other allergic rhinitis: Secondary | ICD-10-CM | POA: Diagnosis not present

## 2023-02-20 DIAGNOSIS — J301 Allergic rhinitis due to pollen: Secondary | ICD-10-CM | POA: Diagnosis not present

## 2023-02-20 DIAGNOSIS — J3081 Allergic rhinitis due to animal (cat) (dog) hair and dander: Secondary | ICD-10-CM | POA: Diagnosis not present

## 2023-02-21 DIAGNOSIS — J3081 Allergic rhinitis due to animal (cat) (dog) hair and dander: Secondary | ICD-10-CM | POA: Diagnosis not present

## 2023-02-21 DIAGNOSIS — J301 Allergic rhinitis due to pollen: Secondary | ICD-10-CM | POA: Diagnosis not present

## 2023-02-21 DIAGNOSIS — J3089 Other allergic rhinitis: Secondary | ICD-10-CM | POA: Diagnosis not present

## 2023-02-26 DIAGNOSIS — J3089 Other allergic rhinitis: Secondary | ICD-10-CM | POA: Diagnosis not present

## 2023-02-26 DIAGNOSIS — J3081 Allergic rhinitis due to animal (cat) (dog) hair and dander: Secondary | ICD-10-CM | POA: Diagnosis not present

## 2023-02-26 DIAGNOSIS — J301 Allergic rhinitis due to pollen: Secondary | ICD-10-CM | POA: Diagnosis not present

## 2023-03-05 DIAGNOSIS — J3081 Allergic rhinitis due to animal (cat) (dog) hair and dander: Secondary | ICD-10-CM | POA: Diagnosis not present

## 2023-03-05 DIAGNOSIS — J3089 Other allergic rhinitis: Secondary | ICD-10-CM | POA: Diagnosis not present

## 2023-03-05 DIAGNOSIS — J301 Allergic rhinitis due to pollen: Secondary | ICD-10-CM | POA: Diagnosis not present

## 2023-03-06 DIAGNOSIS — H43812 Vitreous degeneration, left eye: Secondary | ICD-10-CM | POA: Diagnosis not present

## 2023-03-06 DIAGNOSIS — H2512 Age-related nuclear cataract, left eye: Secondary | ICD-10-CM | POA: Diagnosis not present

## 2023-03-12 DIAGNOSIS — J3089 Other allergic rhinitis: Secondary | ICD-10-CM | POA: Diagnosis not present

## 2023-03-12 DIAGNOSIS — J301 Allergic rhinitis due to pollen: Secondary | ICD-10-CM | POA: Diagnosis not present

## 2023-03-12 DIAGNOSIS — J3081 Allergic rhinitis due to animal (cat) (dog) hair and dander: Secondary | ICD-10-CM | POA: Diagnosis not present

## 2023-03-18 DIAGNOSIS — F410 Panic disorder [episodic paroxysmal anxiety] without agoraphobia: Secondary | ICD-10-CM | POA: Diagnosis not present

## 2023-03-18 DIAGNOSIS — F331 Major depressive disorder, recurrent, moderate: Secondary | ICD-10-CM | POA: Diagnosis not present

## 2023-03-18 DIAGNOSIS — F411 Generalized anxiety disorder: Secondary | ICD-10-CM | POA: Diagnosis not present

## 2023-03-19 DIAGNOSIS — J301 Allergic rhinitis due to pollen: Secondary | ICD-10-CM | POA: Diagnosis not present

## 2023-03-19 DIAGNOSIS — J3089 Other allergic rhinitis: Secondary | ICD-10-CM | POA: Diagnosis not present

## 2023-03-19 DIAGNOSIS — J3081 Allergic rhinitis due to animal (cat) (dog) hair and dander: Secondary | ICD-10-CM | POA: Diagnosis not present

## 2023-03-26 DIAGNOSIS — J3081 Allergic rhinitis due to animal (cat) (dog) hair and dander: Secondary | ICD-10-CM | POA: Diagnosis not present

## 2023-03-26 DIAGNOSIS — J301 Allergic rhinitis due to pollen: Secondary | ICD-10-CM | POA: Diagnosis not present

## 2023-03-26 DIAGNOSIS — J3089 Other allergic rhinitis: Secondary | ICD-10-CM | POA: Diagnosis not present

## 2023-04-02 DIAGNOSIS — J3081 Allergic rhinitis due to animal (cat) (dog) hair and dander: Secondary | ICD-10-CM | POA: Diagnosis not present

## 2023-04-02 DIAGNOSIS — J3089 Other allergic rhinitis: Secondary | ICD-10-CM | POA: Diagnosis not present

## 2023-04-02 DIAGNOSIS — J301 Allergic rhinitis due to pollen: Secondary | ICD-10-CM | POA: Diagnosis not present

## 2023-04-12 ENCOUNTER — Other Ambulatory Visit: Payer: Self-pay | Admitting: Family Medicine

## 2023-04-16 DIAGNOSIS — J3089 Other allergic rhinitis: Secondary | ICD-10-CM | POA: Diagnosis not present

## 2023-04-16 DIAGNOSIS — J301 Allergic rhinitis due to pollen: Secondary | ICD-10-CM | POA: Diagnosis not present

## 2023-04-16 DIAGNOSIS — J3081 Allergic rhinitis due to animal (cat) (dog) hair and dander: Secondary | ICD-10-CM | POA: Diagnosis not present

## 2023-04-17 ENCOUNTER — Telehealth: Payer: Self-pay | Admitting: Family Medicine

## 2023-04-17 NOTE — Telephone Encounter (Signed)
Contacted [facility] to schedule their annual wellness visit. Patient declined to schedule AWV at this time.  Patient works and he's not interested in doing AWV.  Thank you,  [facility] Direct dial  [phone]

## 2023-04-22 DIAGNOSIS — F331 Major depressive disorder, recurrent, moderate: Secondary | ICD-10-CM | POA: Diagnosis not present

## 2023-04-22 DIAGNOSIS — F410 Panic disorder [episodic paroxysmal anxiety] without agoraphobia: Secondary | ICD-10-CM | POA: Diagnosis not present

## 2023-04-22 DIAGNOSIS — F411 Generalized anxiety disorder: Secondary | ICD-10-CM | POA: Diagnosis not present

## 2023-04-23 DIAGNOSIS — J3081 Allergic rhinitis due to animal (cat) (dog) hair and dander: Secondary | ICD-10-CM | POA: Diagnosis not present

## 2023-04-23 DIAGNOSIS — J3089 Other allergic rhinitis: Secondary | ICD-10-CM | POA: Diagnosis not present

## 2023-04-23 DIAGNOSIS — J301 Allergic rhinitis due to pollen: Secondary | ICD-10-CM | POA: Diagnosis not present

## 2023-04-30 DIAGNOSIS — J301 Allergic rhinitis due to pollen: Secondary | ICD-10-CM | POA: Diagnosis not present

## 2023-04-30 DIAGNOSIS — J3089 Other allergic rhinitis: Secondary | ICD-10-CM | POA: Diagnosis not present

## 2023-04-30 DIAGNOSIS — J3081 Allergic rhinitis due to animal (cat) (dog) hair and dander: Secondary | ICD-10-CM | POA: Diagnosis not present

## 2023-05-07 DIAGNOSIS — J301 Allergic rhinitis due to pollen: Secondary | ICD-10-CM | POA: Diagnosis not present

## 2023-05-07 DIAGNOSIS — J3089 Other allergic rhinitis: Secondary | ICD-10-CM | POA: Diagnosis not present

## 2023-05-07 DIAGNOSIS — J3081 Allergic rhinitis due to animal (cat) (dog) hair and dander: Secondary | ICD-10-CM | POA: Diagnosis not present

## 2023-05-11 ENCOUNTER — Other Ambulatory Visit: Payer: Self-pay | Admitting: Family Medicine

## 2023-05-13 ENCOUNTER — Encounter: Payer: Self-pay | Admitting: *Deleted

## 2023-05-13 NOTE — Telephone Encounter (Signed)
Requested medication (s) are due for refill today:   Yes  Requested medication (s) are on the active medication list:   Yes  Future visit scheduled:   No    MyChart message sent to call in and schedule yearly physical   Last ordered: 02/15/2023 #90, 0 refills  Returned because labs are due along with his physical.       Requested Prescriptions  Pending Prescriptions Disp Refills   rosuvastatin (CRESTOR) 5 MG tablet [Pharmacy Med Name: ROSUVASTATIN CALCIUM 5 MG TAB] 90 tablet 0    Sig: TAKE 1 TABLET (5 MG TOTAL) BY MOUTH DAILY.     Cardiovascular:  Antilipid - Statins 2 Failed - 05/11/2023  9:29 AM      Failed - Cr in normal range and within 360 days    Creatinine, Ser  Date Value Ref Range Status  02/13/2022 1.52 (H) 0.76 - 1.27 mg/dL Final         Failed - Valid encounter within last 12 months    Recent Outpatient Visits           1 year ago Hyperlipidemia, unspecified hyperlipidemia type   [facility] Malva Limes, MD   1 year ago Prediabetes   [facility] Malva Limes, MD   3 years ago Essential (primary) hypertension   [facility] Malva Limes, MD   5 years ago Essential (primary) hypertension   [facility] Malva Limes, MD   5 years ago Cellulitis of right thumb   [facility] Malva Limes, MD       Future Appointments             In 4 months Stoioff, Verna Czech, MD [facility] [place]            Failed - Lipid Panel in normal range within the last 12 months    Cholesterol, Total  Date Value Ref Range Status  02/13/2022 149 100 - 199 mg/dL Final   LDL Chol Calc (NIH)  Date Value Ref Range Status  02/13/2022 75 0 - 99 mg/dL Final   HDL  Date Value Ref Range Status  02/13/2022 49 >39 mg/dL Final   Triglycerides  Date Value Ref Range Status  02/13/2022 146 0 - 149 mg/dL Final          Passed - Patient is not pregnant

## 2023-05-16 DIAGNOSIS — J301 Allergic rhinitis due to pollen: Secondary | ICD-10-CM | POA: Diagnosis not present

## 2023-05-16 DIAGNOSIS — J3089 Other allergic rhinitis: Secondary | ICD-10-CM | POA: Diagnosis not present

## 2023-05-16 DIAGNOSIS — J3081 Allergic rhinitis due to animal (cat) (dog) hair and dander: Secondary | ICD-10-CM | POA: Diagnosis not present

## 2023-05-20 DIAGNOSIS — F331 Major depressive disorder, recurrent, moderate: Secondary | ICD-10-CM | POA: Diagnosis not present

## 2023-05-20 DIAGNOSIS — F411 Generalized anxiety disorder: Secondary | ICD-10-CM | POA: Diagnosis not present

## 2023-05-20 DIAGNOSIS — F410 Panic disorder [episodic paroxysmal anxiety] without agoraphobia: Secondary | ICD-10-CM | POA: Diagnosis not present

## 2023-05-21 DIAGNOSIS — J3081 Allergic rhinitis due to animal (cat) (dog) hair and dander: Secondary | ICD-10-CM | POA: Diagnosis not present

## 2023-05-21 DIAGNOSIS — J301 Allergic rhinitis due to pollen: Secondary | ICD-10-CM | POA: Diagnosis not present

## 2023-05-21 DIAGNOSIS — J3089 Other allergic rhinitis: Secondary | ICD-10-CM | POA: Diagnosis not present

## 2023-05-30 DIAGNOSIS — J301 Allergic rhinitis due to pollen: Secondary | ICD-10-CM | POA: Diagnosis not present

## 2023-05-30 DIAGNOSIS — J3089 Other allergic rhinitis: Secondary | ICD-10-CM | POA: Diagnosis not present

## 2023-05-30 DIAGNOSIS — J3081 Allergic rhinitis due to animal (cat) (dog) hair and dander: Secondary | ICD-10-CM | POA: Diagnosis not present

## 2023-06-05 ENCOUNTER — Other Ambulatory Visit: Payer: Self-pay | Admitting: Family Medicine

## 2023-06-05 DIAGNOSIS — J301 Allergic rhinitis due to pollen: Secondary | ICD-10-CM

## 2023-06-06 DIAGNOSIS — J3089 Other allergic rhinitis: Secondary | ICD-10-CM | POA: Diagnosis not present

## 2023-06-06 DIAGNOSIS — J3081 Allergic rhinitis due to animal (cat) (dog) hair and dander: Secondary | ICD-10-CM | POA: Diagnosis not present

## 2023-06-06 DIAGNOSIS — J301 Allergic rhinitis due to pollen: Secondary | ICD-10-CM | POA: Diagnosis not present

## 2023-06-06 NOTE — Telephone Encounter (Signed)
Request is too soon for refill, last refill 07/01/22 for 90 and 3 refills. OV needed for additional refills.  Requested Prescriptions  Pending Prescriptions Disp Refills   fluticasone (FLONASE) 50 MCG/ACT nasal spray [Pharmacy Med Name: FLUTICASONE PROP 50 MCG SPRAY] 48 mL 3    Sig: SPRAY 2 SPRAYS INTO EACH NOSTRIL EVERY DAY     Ear, Nose, and Throat: Nasal Preparations - Corticosteroids Failed - 06/05/2023  5:38 PM      Failed - Valid encounter within last 12 months    Recent Outpatient Visits           1 year ago Hyperlipidemia, unspecified hyperlipidemia type   [facility] Malva Limes, MD   1 year ago Prediabetes   [facility] Malva Limes, MD   3 years ago Essential (primary) hypertension   [facility] Malva Limes, MD   5 years ago Essential (primary) hypertension   [facility] Malva Limes, MD   5 years ago Cellulitis of right thumb   [facility] Malva Limes, MD       Future Appointments             In 3 months Stoioff, Verna Czech, MD [facility] [place]

## 2023-06-11 DIAGNOSIS — J301 Allergic rhinitis due to pollen: Secondary | ICD-10-CM | POA: Diagnosis not present

## 2023-06-11 DIAGNOSIS — J3081 Allergic rhinitis due to animal (cat) (dog) hair and dander: Secondary | ICD-10-CM | POA: Diagnosis not present

## 2023-06-11 DIAGNOSIS — J3089 Other allergic rhinitis: Secondary | ICD-10-CM | POA: Diagnosis not present

## 2023-06-18 DIAGNOSIS — J3089 Other allergic rhinitis: Secondary | ICD-10-CM | POA: Diagnosis not present

## 2023-06-18 DIAGNOSIS — J301 Allergic rhinitis due to pollen: Secondary | ICD-10-CM | POA: Diagnosis not present

## 2023-06-18 DIAGNOSIS — J3081 Allergic rhinitis due to animal (cat) (dog) hair and dander: Secondary | ICD-10-CM | POA: Diagnosis not present

## 2023-06-24 DIAGNOSIS — F331 Major depressive disorder, recurrent, moderate: Secondary | ICD-10-CM | POA: Diagnosis not present

## 2023-06-24 DIAGNOSIS — F411 Generalized anxiety disorder: Secondary | ICD-10-CM | POA: Diagnosis not present

## 2023-06-24 DIAGNOSIS — F410 Panic disorder [episodic paroxysmal anxiety] without agoraphobia: Secondary | ICD-10-CM | POA: Diagnosis not present

## 2023-06-25 DIAGNOSIS — J3089 Other allergic rhinitis: Secondary | ICD-10-CM | POA: Diagnosis not present

## 2023-06-25 DIAGNOSIS — J3081 Allergic rhinitis due to animal (cat) (dog) hair and dander: Secondary | ICD-10-CM | POA: Diagnosis not present

## 2023-06-25 DIAGNOSIS — J301 Allergic rhinitis due to pollen: Secondary | ICD-10-CM | POA: Diagnosis not present

## 2023-07-02 DIAGNOSIS — J3081 Allergic rhinitis due to animal (cat) (dog) hair and dander: Secondary | ICD-10-CM | POA: Diagnosis not present

## 2023-07-02 DIAGNOSIS — J3089 Other allergic rhinitis: Secondary | ICD-10-CM | POA: Diagnosis not present

## 2023-07-02 DIAGNOSIS — J301 Allergic rhinitis due to pollen: Secondary | ICD-10-CM | POA: Diagnosis not present

## 2023-07-05 ENCOUNTER — Other Ambulatory Visit: Payer: Self-pay | Admitting: Family Medicine

## 2023-07-09 DIAGNOSIS — J301 Allergic rhinitis due to pollen: Secondary | ICD-10-CM | POA: Diagnosis not present

## 2023-07-09 DIAGNOSIS — J3081 Allergic rhinitis due to animal (cat) (dog) hair and dander: Secondary | ICD-10-CM | POA: Diagnosis not present

## 2023-07-09 DIAGNOSIS — J3089 Other allergic rhinitis: Secondary | ICD-10-CM | POA: Diagnosis not present

## 2023-07-12 ENCOUNTER — Ambulatory Visit (INDEPENDENT_AMBULATORY_CARE_PROVIDER_SITE_OTHER): Payer: Medicare HMO | Admitting: Family Medicine

## 2023-07-12 VITALS — BP 118/77 | HR 63 | Temp 98.5°F | Ht 72.0 in | Wt 197.5 lb

## 2023-07-12 DIAGNOSIS — E785 Hyperlipidemia, unspecified: Secondary | ICD-10-CM | POA: Diagnosis not present

## 2023-07-12 DIAGNOSIS — Z125 Encounter for screening for malignant neoplasm of prostate: Secondary | ICD-10-CM

## 2023-07-12 DIAGNOSIS — I10 Essential (primary) hypertension: Secondary | ICD-10-CM | POA: Diagnosis not present

## 2023-07-12 DIAGNOSIS — F419 Anxiety disorder, unspecified: Secondary | ICD-10-CM

## 2023-07-12 DIAGNOSIS — Z2821 Immunization not carried out because of patient refusal: Secondary | ICD-10-CM

## 2023-07-12 NOTE — Progress Notes (Signed)
      Established patient visit   Patient: Kurt Edwards   DOB: May 26, 1956   67 y.o. Male  MRN: [id number] Visit Date: 07/12/2023  Today's healthcare provider: Mila Merry, MD   Chief Complaint  Patient presents with   Hyperlipidemia    Pt states that he is here for yearly f/u on cholesterol   Subjective    Discussed the use of AI scribe software for clinical note transcription with the patient, who gave verbal consent to proceed.  History of Present Illness   The patient, with a history of hypertension and hypercholesterolemia, presents for a follow-up visit. He reports adherence to his prescribed medications, including rosuvastatin for cholesterol management and losartan for blood pressure control. He denies any recent health issues, specifically denying any shortness of breath, chest pain, or swelling in the hands, feet, or ankles.  The patient does not regularly monitor his blood pressure at home but remains active, planning to increase his physical activity with walking as the weather improves. He has not eaten prior to the visit       Medications: Outpatient Medications Prior to Visit  Medication Sig   ARIPiprazole (ABILIFY) 5 MG tablet Take 5 mg by mouth daily.   buPROPion (WELLBUTRIN XL) 150 MG 24 hr tablet Take 150 mg by mouth every morning.   clonazePAM (KLONOPIN) 0.5 MG tablet TAKE 1 DAILY AS NEEDED   desvenlafaxine (PRISTIQ) 100 MG 24 hr tablet Take 100 mg by mouth daily.   fluticasone (FLONASE) 50 MCG/ACT nasal spray SPRAY 2 SPRAYS INTO EACH NOSTRIL EVERY DAY   lamoTRIgine (LAMICTAL) 200 MG tablet Take 200 mg by mouth daily.   losartan (COZAAR) 100 MG tablet Take 1 tablet (100 mg total) by mouth daily. Please schedule office visit before any future refill   rosuvastatin (CRESTOR) 5 MG tablet TAKE 1 TABLET (5 MG TOTAL) BY MOUTH DAILY.   sildenafil (VIAGRA) 100 MG tablet Take 1 tablet 1 hour prior to intercourse.   tadalafil (CIALIS) 20 MG tablet TAKE ONE TABLET BY  MOUTH ONE HOUR PRIOR TO INTERCOURSE. NO MORE THAN ONE IN A 24 HOURS PERIOD   No facility-administered medications prior to visit.      Objective    BP 118/77 (BP Location: Left Arm, Patient Position: Sitting, Cuff Size: Normal)   Pulse 63   Temp 98.5 F (36.9 C) (Oral)   Ht 6' (1.829 m)   Wt 197 lb 8 oz (89.6 kg)   SpO2 99%   BMI 26.79 kg/m    Physical Exam   CHEST: Lungs clear to auscultation. CARDIOVASCULAR: Heart sounds normal on auscultation. EXTREMITIES: No edema in hands, feet, or ankles.    No results found for any visits on 07/12/23.  Assessment & Plan        Hypertension Well controlled on Losartan. No symptoms of end-organ damage. -Continue Losartan.  Hyperlipidemia On Rosuvastatin. No reported side effects. -Continue Rosuvastatin. -Order lipid panel today to assess control.  General Health Maintenance -Discussed pneumonia vaccine. Patient declined at this time. Advised that it is a one-time vaccine and available at local pharmacies.     General anxiety -managed by psychiatry, doing well current medications.      Mila Merry, MD  [facility] [phone] (phone) [phone] (fax)  [facility]

## 2023-07-13 LAB — LIPID PANEL
Chol/HDL Ratio: 3.3 {ratio} (ref 0.0–5.0)
Cholesterol, Total: 163 mg/dL (ref 100–199)
HDL: 49 mg/dL (ref >39)
LDL Chol Calc (NIH): 91 mg/dL (ref 0–99)
Triglycerides: 127 mg/dL (ref 0–149)
VLDL Cholesterol Cal: 23 mg/dL (ref 5–40)

## 2023-07-13 LAB — CBC
Hematocrit: 41.7 % (ref 37.5–51.0)
Hemoglobin: 14.1 g/dL (ref 13.0–17.7)
MCH: 32.5 pg (ref 26.6–33.0)
MCHC: 33.8 g/dL (ref 31.5–35.7)
MCV: 96 fL (ref 79–97)
Platelets: 259 10*3/uL (ref 150–450)
RBC: 4.34 x10E6/uL (ref 4.14–5.80)
RDW: 12.5 % (ref 11.6–15.4)
WBC: 4.9 10*3/uL (ref 3.4–10.8)

## 2023-07-13 LAB — COMPREHENSIVE METABOLIC PANEL
ALT: 39 [IU]/L (ref 0–44)
AST: 29 [IU]/L (ref 0–40)
Albumin: 4.2 g/dL (ref 3.9–4.9)
Alkaline Phosphatase: 95 [IU]/L (ref 44–121)
BUN/Creatinine Ratio: 18 (ref 10–24)
BUN: 24 mg/dL (ref 8–27)
Bilirubin Total: 0.2 mg/dL (ref 0.0–1.2)
CO2: 22 mmol/L (ref 20–29)
Calcium: 9.1 mg/dL (ref 8.6–10.2)
Chloride: 107 mmol/L — ABNORMAL HIGH (ref 96–106)
Creatinine, Ser: 1.37 mg/dL — ABNORMAL HIGH (ref 0.76–1.27)
Globulin, Total: 2.1 g/dL (ref 1.5–4.5)
Glucose: 109 mg/dL — ABNORMAL HIGH (ref 70–99)
Potassium: 4.8 mmol/L (ref 3.5–5.2)
Sodium: 141 mmol/L (ref 134–144)
Total Protein: 6.3 g/dL (ref 6.0–8.5)
eGFR: 57 mL/min/{1.73_m2} — ABNORMAL LOW (ref >59)

## 2023-07-13 LAB — FPSA% REFLEX
% FREE PSA: 6.5 %
PSA, FREE: 0.37 ng/mL

## 2023-07-13 LAB — PSA TOTAL (REFLEX TO FREE): Prostate Specific Ag, Serum: 5.7 ng/mL — ABNORMAL HIGH (ref 0.0–4.0)

## 2023-07-15 ENCOUNTER — Encounter: Payer: Self-pay | Admitting: *Deleted

## 2023-07-15 ENCOUNTER — Other Ambulatory Visit: Payer: Self-pay | Admitting: *Deleted

## 2023-07-15 DIAGNOSIS — R972 Elevated prostate specific antigen [PSA]: Secondary | ICD-10-CM

## 2023-07-16 DIAGNOSIS — J301 Allergic rhinitis due to pollen: Secondary | ICD-10-CM | POA: Diagnosis not present

## 2023-07-16 DIAGNOSIS — J3081 Allergic rhinitis due to animal (cat) (dog) hair and dander: Secondary | ICD-10-CM | POA: Diagnosis not present

## 2023-07-16 DIAGNOSIS — J3089 Other allergic rhinitis: Secondary | ICD-10-CM | POA: Diagnosis not present

## 2023-07-17 ENCOUNTER — Other Ambulatory Visit: Payer: Self-pay | Admitting: Family Medicine

## 2023-07-17 DIAGNOSIS — J301 Allergic rhinitis due to pollen: Secondary | ICD-10-CM

## 2023-07-17 NOTE — Telephone Encounter (Signed)
Requested Prescriptions  Pending Prescriptions Disp Refills   fluticasone (FLONASE) 50 MCG/ACT nasal spray [Pharmacy Med Name: FLUTICASONE PROP 50 MCG SPRAY] 48 mL 3    Sig: SPRAY 2 SPRAYS INTO EACH NOSTRIL EVERY DAY     Ear, Nose, and Throat: Nasal Preparations - Corticosteroids Passed - 07/17/2023  9:32 AM      Passed - Valid encounter within last 12 months    Recent Outpatient Visits           5 days ago Hyperlipidemia, unspecified hyperlipidemia type   [facility] Malva Limes, MD   1 year ago Hyperlipidemia, unspecified hyperlipidemia type   [facility] Malva Limes, MD   1 year ago Prediabetes   [facility] Malva Limes, MD   3 years ago Essential (primary) hypertension   [facility] Malva Limes, MD   5 years ago Essential (primary) hypertension   [facility] Fisher, Demetrios Isaacs, MD       Future Appointments             In 2 months Stoioff, Verna Czech, MD [facility] [address]

## 2023-07-19 DIAGNOSIS — J301 Allergic rhinitis due to pollen: Secondary | ICD-10-CM | POA: Diagnosis not present

## 2023-07-19 DIAGNOSIS — J3089 Other allergic rhinitis: Secondary | ICD-10-CM | POA: Diagnosis not present

## 2023-07-19 DIAGNOSIS — J3081 Allergic rhinitis due to animal (cat) (dog) hair and dander: Secondary | ICD-10-CM | POA: Diagnosis not present

## 2023-07-21 ENCOUNTER — Other Ambulatory Visit: Payer: Self-pay | Admitting: Family Medicine

## 2023-07-22 ENCOUNTER — Encounter: Payer: Self-pay | Admitting: Family Medicine

## 2023-07-22 DIAGNOSIS — R7303 Prediabetes: Secondary | ICD-10-CM

## 2023-07-23 MED ORDER — LOSARTAN POTASSIUM 100 MG PO TABS: 100.0000 mg | ORAL_TABLET | Freq: Every day | ORAL | 0 refills | Status: DC

## 2023-07-24 ENCOUNTER — Ambulatory Visit
Admission: RE | Admit: 2023-07-24 | Discharge: 2023-07-24 | Disposition: A | Discharge status: Home / Self Care | Payer: Medicare HMO | Source: Ambulatory Visit | Attending: Urology | Admitting: Urology

## 2023-07-24 DIAGNOSIS — N4289 Other specified disorders of prostate: Secondary | ICD-10-CM | POA: Diagnosis not present

## 2023-07-24 DIAGNOSIS — R972 Elevated prostate specific antigen [PSA]: Secondary | ICD-10-CM | POA: Diagnosis not present

## 2023-07-24 DIAGNOSIS — K5730 Diverticulosis of large intestine without perforation or abscess without bleeding: Secondary | ICD-10-CM | POA: Diagnosis not present

## 2023-07-24 MED ORDER — GADOBUTROL 1 MMOL/ML IV SOLN
8.0000 mL | Freq: Once | INTRAVENOUS | Status: AC | PRN
  Administered 2023-07-24: 8 mL via INTRAVENOUS

## 2023-07-25 DIAGNOSIS — J3081 Allergic rhinitis due to animal (cat) (dog) hair and dander: Secondary | ICD-10-CM | POA: Diagnosis not present

## 2023-07-25 DIAGNOSIS — J3089 Other allergic rhinitis: Secondary | ICD-10-CM | POA: Diagnosis not present

## 2023-07-25 DIAGNOSIS — J301 Allergic rhinitis due to pollen: Secondary | ICD-10-CM | POA: Diagnosis not present

## 2023-07-26 ENCOUNTER — Telehealth: Payer: Self-pay

## 2023-07-26 ENCOUNTER — Encounter: Payer: Self-pay | Admitting: Family Medicine

## 2023-07-26 DIAGNOSIS — R972 Elevated prostate specific antigen [PSA]: Secondary | ICD-10-CM

## 2023-07-26 NOTE — Addendum Note (Signed)
Addended by: Mila Merry E on: 07/26/2023 11:43 AM   Modules accepted: Orders

## 2023-07-26 NOTE — Telephone Encounter (Signed)
Copied from CRM [id number]. Topic: Referral - Request for Referral >> Jul 26, 2023  8:40 AM Franchot Heidelberg wrote: Has patient seen PCP for this complaint? Yes.   *If NO, is insurance requiring patient see PCP for this issue before PCP can refer them? Referral for which specialty: Urology  Preferred provider/office: [facility] fax: [phone] Reason for referral: High PSA reading

## 2023-07-30 DIAGNOSIS — R972 Elevated prostate specific antigen [PSA]: Secondary | ICD-10-CM | POA: Diagnosis not present

## 2023-08-01 DIAGNOSIS — J3089 Other allergic rhinitis: Secondary | ICD-10-CM | POA: Diagnosis not present

## 2023-08-01 DIAGNOSIS — J301 Allergic rhinitis due to pollen: Secondary | ICD-10-CM | POA: Diagnosis not present

## 2023-08-01 DIAGNOSIS — R7303 Prediabetes: Secondary | ICD-10-CM | POA: Diagnosis not present

## 2023-08-01 DIAGNOSIS — J3081 Allergic rhinitis due to animal (cat) (dog) hair and dander: Secondary | ICD-10-CM | POA: Diagnosis not present

## 2023-08-02 ENCOUNTER — Telehealth: Payer: Self-pay

## 2023-08-02 LAB — HEMOGLOBIN A1C
Est. average glucose Bld gHb Est-mCnc: 120 mg/dL
Hgb A1c MFr Bld: 5.8 % — ABNORMAL HIGH (ref 4.8–5.6)

## 2023-08-02 NOTE — Telephone Encounter (Signed)
Patient called in and left voicemail. Would like a call to discuss his meds prior to his fusion biopsy.

## 2023-08-02 NOTE — Telephone Encounter (Signed)
Called patient to discuss his questions, no answer and I was not able to leave a message.

## 2023-08-05 DIAGNOSIS — F411 Generalized anxiety disorder: Secondary | ICD-10-CM | POA: Diagnosis not present

## 2023-08-05 DIAGNOSIS — F410 Panic disorder [episodic paroxysmal anxiety] without agoraphobia: Secondary | ICD-10-CM | POA: Diagnosis not present

## 2023-08-05 DIAGNOSIS — F331 Major depressive disorder, recurrent, moderate: Secondary | ICD-10-CM | POA: Diagnosis not present

## 2023-08-06 DIAGNOSIS — J301 Allergic rhinitis due to pollen: Secondary | ICD-10-CM | POA: Diagnosis not present

## 2023-08-06 DIAGNOSIS — J3081 Allergic rhinitis due to animal (cat) (dog) hair and dander: Secondary | ICD-10-CM | POA: Diagnosis not present

## 2023-08-06 DIAGNOSIS — J3089 Other allergic rhinitis: Secondary | ICD-10-CM | POA: Diagnosis not present

## 2023-08-13 DIAGNOSIS — J3081 Allergic rhinitis due to animal (cat) (dog) hair and dander: Secondary | ICD-10-CM | POA: Diagnosis not present

## 2023-08-13 DIAGNOSIS — H1045 Other chronic allergic conjunctivitis: Secondary | ICD-10-CM | POA: Diagnosis not present

## 2023-08-13 DIAGNOSIS — J301 Allergic rhinitis due to pollen: Secondary | ICD-10-CM | POA: Diagnosis not present

## 2023-08-13 DIAGNOSIS — J3089 Other allergic rhinitis: Secondary | ICD-10-CM | POA: Diagnosis not present

## 2023-08-22 DIAGNOSIS — J3089 Other allergic rhinitis: Secondary | ICD-10-CM | POA: Diagnosis not present

## 2023-08-22 DIAGNOSIS — J301 Allergic rhinitis due to pollen: Secondary | ICD-10-CM | POA: Diagnosis not present

## 2023-08-22 DIAGNOSIS — J3081 Allergic rhinitis due to animal (cat) (dog) hair and dander: Secondary | ICD-10-CM | POA: Diagnosis not present

## 2023-08-27 ENCOUNTER — Encounter: Payer: Self-pay | Admitting: *Deleted

## 2023-08-27 ENCOUNTER — Telehealth: Payer: Self-pay | Admitting: *Deleted

## 2023-08-27 DIAGNOSIS — J3089 Other allergic rhinitis: Secondary | ICD-10-CM | POA: Diagnosis not present

## 2023-08-27 DIAGNOSIS — J301 Allergic rhinitis due to pollen: Secondary | ICD-10-CM | POA: Diagnosis not present

## 2023-08-27 DIAGNOSIS — J3081 Allergic rhinitis due to animal (cat) (dog) hair and dander: Secondary | ICD-10-CM | POA: Diagnosis not present

## 2023-08-27 NOTE — Telephone Encounter (Signed)
Prostate Biopsy Instructions  Stop all aspirin or blood thinners (aspirin, plavix, coumadin, warfarin, motrin, ibuprofen, advil, aleve, naproxen, naprosyn) for 7 days prior to the procedure.  If you have any questions about stopping these medications, please contact your primary care physician or cardiologist.  Having a light meal prior to the procedure is recommended.  If you are diabetic or have low blood sugar please bring a small snack or glucose tablet.  A Fleets enema is needed to be purchased over the counter at a local pharmacy and used 2 hours before you scheduled appointment.  This can be purchased over the counter at any pharmacy.  Antibiotics will be administered in the clinic at the time of the procedure unless otherwise specified.    Please bring someone with you to the procedure to drive you home.  A follow up appointment has been scheduled for you to receive the results of the biopsy.  If you have any questions or concerns, please feel free to call the office at [phone] or send a Mychart message.    Thank you, Staff at [facility]

## 2023-09-04 ENCOUNTER — Other Ambulatory Visit: Payer: Self-pay | Admitting: Urology

## 2023-09-04 ENCOUNTER — Ambulatory Visit (INDEPENDENT_AMBULATORY_CARE_PROVIDER_SITE_OTHER): Payer: Medicare HMO | Admitting: Urology

## 2023-09-04 ENCOUNTER — Encounter: Payer: Self-pay | Admitting: Urology

## 2023-09-04 VITALS — BP 107/69 | HR 71 | Ht 72.0 in | Wt 197.0 lb

## 2023-09-04 DIAGNOSIS — R972 Elevated prostate specific antigen [PSA]: Secondary | ICD-10-CM | POA: Diagnosis not present

## 2023-09-04 DIAGNOSIS — C61 Malignant neoplasm of prostate: Secondary | ICD-10-CM

## 2023-09-04 DIAGNOSIS — Z2989 Encounter for other specified prophylactic measures: Secondary | ICD-10-CM

## 2023-09-04 MED ORDER — LEVOFLOXACIN 500 MG PO TABS
500.0000 mg | ORAL_TABLET | Freq: Once | ORAL | Status: AC
Start: 2023-09-04 — End: 2023-09-04
  Administered 2023-09-04: 500 mg via ORAL

## 2023-09-04 MED ORDER — DIAZEPAM 10 MG PO TABS: 10.0000 mg | ORAL_TABLET | Freq: Once | ORAL | 0 refills | Status: AC | PRN

## 2023-09-04 MED ORDER — GENTAMICIN SULFATE 40 MG/ML IJ SOLN
80.0000 mg | Freq: Once | INTRAMUSCULAR | Status: AC
Start: 2023-09-04 — End: 2023-09-04
  Administered 2023-09-04: 80 mg via INTRAMUSCULAR

## 2023-09-04 NOTE — Progress Notes (Signed)
   09/04/23  Indication: Elevated PSA, 5.7  MRI Fusion Prostate Biopsy Procedure   Informed consent was obtained, and we discussed the risks of bleeding and infection/sepsis. A time out was performed to ensure correct patient identity.  Pre-Procedure: - Last PSA Level: 5.7 - Gentamicin and levaquin given for antibiotic prophylaxis - Prostate measured 26 g on MRI, PSA density 0.22 - ROI clearly hypoechoic on ultrasound, no median lobe  Procedure: - Prostate block performed using 10 cc 1% lidocaine  - MRI fusion biopsy was performed, and 4 biopsies were taken from the ROI#1 PIRADS 4 lesion located right posterior lateral peripheral zone - Standard biopsies taken from sextant areas, 12 under ultrasound guidance. - Total of 16 cores taken  Post-Procedure: - Patient tolerated the procedure well - He was counseled to seek immediate medical attention if experiences significant bleeding, fevers, or severe pain - Return in one week to discuss biopsy results  Assessment/ Plan: Will follow up in 1-2 weeks to discuss pathology with Dr. Mort Sawyers, MD 09/04/2023

## 2023-09-04 NOTE — Patient Instructions (Signed)

## 2023-09-05 DIAGNOSIS — J3081 Allergic rhinitis due to animal (cat) (dog) hair and dander: Secondary | ICD-10-CM | POA: Diagnosis not present

## 2023-09-05 DIAGNOSIS — J301 Allergic rhinitis due to pollen: Secondary | ICD-10-CM | POA: Diagnosis not present

## 2023-09-05 DIAGNOSIS — J3089 Other allergic rhinitis: Secondary | ICD-10-CM | POA: Diagnosis not present

## 2023-09-11 ENCOUNTER — Telehealth: Payer: Self-pay

## 2023-09-11 DIAGNOSIS — F411 Generalized anxiety disorder: Secondary | ICD-10-CM | POA: Diagnosis not present

## 2023-09-11 DIAGNOSIS — F331 Major depressive disorder, recurrent, moderate: Secondary | ICD-10-CM | POA: Diagnosis not present

## 2023-09-11 DIAGNOSIS — C61 Malignant neoplasm of prostate: Secondary | ICD-10-CM

## 2023-09-11 DIAGNOSIS — F410 Panic disorder [episodic paroxysmal anxiety] without agoraphobia: Secondary | ICD-10-CM | POA: Diagnosis not present

## 2023-09-11 NOTE — Telephone Encounter (Signed)
Pt is requesting prostate bx results prior to his 12/6 appt.   Pls advise.

## 2023-09-17 DIAGNOSIS — J301 Allergic rhinitis due to pollen: Secondary | ICD-10-CM | POA: Diagnosis not present

## 2023-09-17 DIAGNOSIS — J3081 Allergic rhinitis due to animal (cat) (dog) hair and dander: Secondary | ICD-10-CM | POA: Diagnosis not present

## 2023-09-17 DIAGNOSIS — J3089 Other allergic rhinitis: Secondary | ICD-10-CM | POA: Diagnosis not present

## 2023-09-17 NOTE — Telephone Encounter (Signed)
Patient was contacted and he had no postbiopsy complaints.  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  An additional core from the [facility] showed Gleason 4+4 involving 53% of the submitted tissue; right apex and lateral apex biopsies showed Gleason 4+3 adenocarcinoma involving 21% and 11% of the submitted tissue respectively  The pathology report was discussed with Mr. Kurt Edwards and we discussed his risk stratification as high.  We briefly discussed management options for high risk prostate cancer including RP + PLND and various radiation modalities.  Recommend PSMA/PET for staging.  We discussed the availability of multidisciplinary oncology clinics at [facility] and [place].  He has a follow-up scheduled 09/20/2023 and desires to keep at this time as his wife was not present during this call.

## 2023-09-18 ENCOUNTER — Encounter: Payer: Self-pay | Admitting: Urology

## 2023-09-20 ENCOUNTER — Ambulatory Visit: Payer: Medicare HMO | Admitting: Urology

## 2023-09-20 ENCOUNTER — Encounter: Payer: Self-pay | Admitting: Urology

## 2023-09-20 VITALS — BP 123/80 | HR 73 | Ht 72.0 in | Wt 194.0 lb

## 2023-09-20 DIAGNOSIS — C61 Malignant neoplasm of prostate: Secondary | ICD-10-CM | POA: Diagnosis not present

## 2023-09-20 NOTE — Progress Notes (Signed)
I, Kurt Edwards, acting as a scribe for Kurt Altes, MD., have documented all relevant documentation on the behalf of Kurt Altes, MD, as directed by Kurt Altes, MD while in the presence of Kurt Altes, MD.  09/20/2023 1:58 PM   Kurt Edwards [birth year] [id number]  Referring provider: Malva Limes, MD [address] [place],  [place] [address]  Chief Complaint  Patient presents with   Results   Urologic history: 1.  Erectile dysfunction sildenafil   2.  Elevated PSA  HPI: Kurt Edwards is a 67 y.o. male presents for prostate biopsy follow-up.  Status post MRI fusion biopsy 09/04/23 for a PSA of 5.7 and MRI showing a PI-RADS 4 lesion right PZ. That he underwent a standard 12-core template biopsy and 4 ROI cores. He was contacted by phone earlier this week regarding the pathology results and wanted to keep this appointment today as his wife was not present during our recent call. 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 RML biopsy showed Gleason 4+4 adenocarcinoma involving 53% of the submitted tissue; the right apical and right lateral apical biopsies showed Gleason 4+3 adenocarcinoma involving 21% and 11% respectively. PSMA/PET has been ordered but not yet scheduled.  PSA trend   Prostate Specific Ag, Serum Reflex Criteria  Latest Ref Rng 0.0 - 4.0 ng/mL   02/22/2020 4.6 (H)  Comment   04/07/2020 4.1 (H)    09/05/2021 4.7 (H)  Comment   02/13/2022 4.6 (H)    07/12/2023 5.7 (H)  Comment      PMH: Past Medical History:  Diagnosis Date   Anxiety    Hypercholesteremia    Hypertension     Surgical History: Past Surgical History:  Procedure Laterality Date   CHOLECYSTECTOMY     NASAL SEPTUM SURGERY     OTHER SURGICAL HISTORY     arm surgery, right, fracture, 2009   PALATE / UVULA BIOPSY / EXCISION     TONSILLECTOMY     UPPER GI ENDOSCOPY  09/27/11   H.Pylori negative, reactive gastropathy,  no Barretts.    Home Medications:  Allergies as of 09/20/2023       Reactions   Amlodipine Besylate Hives   Possible reaction to amlodipine   Atorvastatin    Other reaction(s): Muscle Pain        Medication List        Accurate as of September 20, 2023  1:58 PM. If you have any questions, ask your nurse or doctor.          ARIPiprazole 2 MG tablet Commonly known as: ABILIFY Take 2 mg by mouth daily.   buPROPion 150 MG 24 hr tablet Commonly known as: WELLBUTRIN XL Take 150 mg by mouth every morning.   Claritin 10 MG tablet Generic drug: loratadine Take 10 mg by mouth daily.   desvenlafaxine 100 MG 24 hr tablet Commonly known as: PRISTIQ Take 100 mg by mouth daily.   Desvenlafaxine ER 100 MG Tb24 Take 1 tablet by mouth daily.   diazepam 10 MG tablet Commonly known as: Valium Take 1 tablet (10 mg total) by mouth once as needed for up to 1 dose for anxiety (take 45 minutes prior to prostate biopsy).   fluticasone 50 MCG/ACT nasal spray Commonly known as: FLONASE SPRAY 2 SPRAYS INTO EACH NOSTRIL EVERY DAY   lamoTRIgine 200 MG tablet Commonly known as: LAMICTAL Take 200 mg by mouth daily.  losartan 100 MG tablet Commonly known as: COZAAR Take 1 tablet (100 mg total) by mouth daily. Please schedule office visit before any future refill   rosuvastatin 5 MG tablet Commonly known as: CRESTOR TAKE 1 TABLET (5 MG TOTAL) BY MOUTH DAILY.   sildenafil 100 MG tablet Commonly known as: VIAGRA Take 1 tablet 1 hour prior to intercourse.   tadalafil 20 MG tablet Commonly known as: CIALIS TAKE ONE TABLET BY MOUTH ONE HOUR PRIOR TO INTERCOURSE. NO MORE THAN ONE IN A 24 HOURS PERIOD        Allergies:  Allergies  Allergen Reactions   Amlodipine Besylate Hives    Possible reaction to amlodipine   Atorvastatin     Other reaction(s): Muscle Pain    Family History: Family History  Problem Relation Age of Onset   Hypertension Mother    Emphysema Father      Social History:  reports that he has never smoked. He has never used smokeless tobacco. He reports that he does not drink alcohol and does not use drugs.   Physical Exam: BP 123/80   Pulse 73   Ht 6' (1.829 m)   Wt 194 lb (88 kg)   BMI 26.31 kg/m   Constitutional:  Alert and oriented, No acute distress. HEENT: [place] AT, moist mucus membranes.  Trachea midline, no masses. Cardiovascular: No clubbing, cyanosis, or edema. Respiratory: Normal respiratory effort, no increased work of breathing. GI: Abdomen is soft, nontender, nondistended, no abdominal masses Skin: No rashes, bruises or suspicious lesions. Neurologic: Grossly intact, no focal deficits, moving all 4 extremities. Psychiatric: Normal mood and affect.   Assessment & Plan:    1. Prostate cancer Clinical [id number] high-risk prostate cancer.  If he has no evidence of distant mets, I again reviewed standard treatment options outlined in my telephone encounter on 09/17/2023.  They are most interested in radiation therapy and have requested referrals to [facility] to discuss possibility of CyberKnife radiation and to [facility] to discuss NanoKnife radiation. Referrals were made. He will be notified with his PET scan results.  [facility] [address], [address] [place], [place] [address] [phone]

## 2023-09-21 ENCOUNTER — Encounter: Payer: Self-pay | Admitting: Urology

## 2023-09-24 ENCOUNTER — Other Ambulatory Visit: Payer: Self-pay | Admitting: Urology

## 2023-09-24 DIAGNOSIS — J301 Allergic rhinitis due to pollen: Secondary | ICD-10-CM | POA: Diagnosis not present

## 2023-09-24 DIAGNOSIS — C61 Malignant neoplasm of prostate: Secondary | ICD-10-CM

## 2023-09-24 DIAGNOSIS — J3089 Other allergic rhinitis: Secondary | ICD-10-CM | POA: Diagnosis not present

## 2023-09-24 DIAGNOSIS — J3081 Allergic rhinitis due to animal (cat) (dog) hair and dander: Secondary | ICD-10-CM | POA: Diagnosis not present

## 2023-09-30 ENCOUNTER — Other Ambulatory Visit: Payer: Federal, State, Local not specified - PPO

## 2023-09-30 ENCOUNTER — Ambulatory Visit
Admission: RE | Admit: 2023-09-30 | Discharge: 2023-09-30 | Disposition: A | Discharge status: Home / Self Care | Payer: Medicare HMO | Source: Ambulatory Visit | Attending: Urology | Admitting: Urology

## 2023-09-30 DIAGNOSIS — K4090 Unilateral inguinal hernia, without obstruction or gangrene, not specified as recurrent: Secondary | ICD-10-CM | POA: Diagnosis not present

## 2023-09-30 DIAGNOSIS — C61 Malignant neoplasm of prostate: Secondary | ICD-10-CM | POA: Diagnosis not present

## 2023-09-30 MED ORDER — FLOTUFOLASTAT F 18 GALLIUM 296-5846 MBQ/ML IV SOLN
8.6500 | Freq: Once | INTRAVENOUS | Status: AC
  Administered 2023-09-30: 8.65 via INTRAVENOUS
  Filled 2023-09-30: qty 9

## 2023-10-02 ENCOUNTER — Encounter: Payer: Self-pay | Admitting: Urology

## 2023-10-03 DIAGNOSIS — J3089 Other allergic rhinitis: Secondary | ICD-10-CM | POA: Diagnosis not present

## 2023-10-03 DIAGNOSIS — J301 Allergic rhinitis due to pollen: Secondary | ICD-10-CM | POA: Diagnosis not present

## 2023-10-03 DIAGNOSIS — J3081 Allergic rhinitis due to animal (cat) (dog) hair and dander: Secondary | ICD-10-CM | POA: Diagnosis not present

## 2023-10-03 DIAGNOSIS — C61 Malignant neoplasm of prostate: Secondary | ICD-10-CM | POA: Diagnosis not present

## 2023-10-10 ENCOUNTER — Institutional Professional Consult (permissible substitution): Payer: Federal, State, Local not specified - PPO | Admitting: Radiation Oncology

## 2023-10-15 ENCOUNTER — Other Ambulatory Visit: Payer: Self-pay | Admitting: Family Medicine

## 2023-10-17 DIAGNOSIS — J301 Allergic rhinitis due to pollen: Secondary | ICD-10-CM | POA: Diagnosis not present

## 2023-10-17 DIAGNOSIS — J3081 Allergic rhinitis due to animal (cat) (dog) hair and dander: Secondary | ICD-10-CM | POA: Diagnosis not present

## 2023-10-17 DIAGNOSIS — J3089 Other allergic rhinitis: Secondary | ICD-10-CM | POA: Diagnosis not present

## 2023-10-21 DIAGNOSIS — C61 Malignant neoplasm of prostate: Secondary | ICD-10-CM | POA: Diagnosis not present

## 2023-10-22 ENCOUNTER — Encounter: Payer: Self-pay | Admitting: Family Medicine

## 2023-10-22 DIAGNOSIS — J301 Allergic rhinitis due to pollen: Secondary | ICD-10-CM | POA: Diagnosis not present

## 2023-10-22 DIAGNOSIS — C61 Malignant neoplasm of prostate: Secondary | ICD-10-CM | POA: Insufficient documentation

## 2023-10-22 DIAGNOSIS — J3081 Allergic rhinitis due to animal (cat) (dog) hair and dander: Secondary | ICD-10-CM | POA: Diagnosis not present

## 2023-10-22 DIAGNOSIS — J3089 Other allergic rhinitis: Secondary | ICD-10-CM | POA: Diagnosis not present

## 2023-10-25 DIAGNOSIS — F410 Panic disorder [episodic paroxysmal anxiety] without agoraphobia: Secondary | ICD-10-CM | POA: Diagnosis not present

## 2023-10-25 DIAGNOSIS — F411 Generalized anxiety disorder: Secondary | ICD-10-CM | POA: Diagnosis not present

## 2023-10-25 DIAGNOSIS — F331 Major depressive disorder, recurrent, moderate: Secondary | ICD-10-CM | POA: Diagnosis not present

## 2023-10-29 DIAGNOSIS — J301 Allergic rhinitis due to pollen: Secondary | ICD-10-CM | POA: Diagnosis not present

## 2023-10-29 DIAGNOSIS — J3081 Allergic rhinitis due to animal (cat) (dog) hair and dander: Secondary | ICD-10-CM | POA: Diagnosis not present

## 2023-10-29 DIAGNOSIS — J3089 Other allergic rhinitis: Secondary | ICD-10-CM | POA: Diagnosis not present

## 2023-11-01 DIAGNOSIS — C61 Malignant neoplasm of prostate: Secondary | ICD-10-CM | POA: Diagnosis not present

## 2023-11-05 DIAGNOSIS — J3081 Allergic rhinitis due to animal (cat) (dog) hair and dander: Secondary | ICD-10-CM | POA: Diagnosis not present

## 2023-11-05 DIAGNOSIS — J301 Allergic rhinitis due to pollen: Secondary | ICD-10-CM | POA: Diagnosis not present

## 2023-11-05 DIAGNOSIS — J3089 Other allergic rhinitis: Secondary | ICD-10-CM | POA: Diagnosis not present

## 2023-11-08 DIAGNOSIS — C61 Malignant neoplasm of prostate: Secondary | ICD-10-CM | POA: Diagnosis not present

## 2023-11-08 DIAGNOSIS — K4090 Unilateral inguinal hernia, without obstruction or gangrene, not specified as recurrent: Secondary | ICD-10-CM | POA: Diagnosis not present

## 2023-11-08 DIAGNOSIS — Z191 Hormone sensitive malignancy status: Secondary | ICD-10-CM | POA: Diagnosis not present

## 2023-11-14 DIAGNOSIS — J3089 Other allergic rhinitis: Secondary | ICD-10-CM | POA: Diagnosis not present

## 2023-11-14 DIAGNOSIS — J301 Allergic rhinitis due to pollen: Secondary | ICD-10-CM | POA: Diagnosis not present

## 2023-11-14 DIAGNOSIS — J3081 Allergic rhinitis due to animal (cat) (dog) hair and dander: Secondary | ICD-10-CM | POA: Diagnosis not present

## 2023-11-26 DIAGNOSIS — J3089 Other allergic rhinitis: Secondary | ICD-10-CM | POA: Diagnosis not present

## 2023-11-26 DIAGNOSIS — J3081 Allergic rhinitis due to animal (cat) (dog) hair and dander: Secondary | ICD-10-CM | POA: Diagnosis not present

## 2023-11-26 DIAGNOSIS — J301 Allergic rhinitis due to pollen: Secondary | ICD-10-CM | POA: Diagnosis not present

## 2023-11-29 DIAGNOSIS — J3081 Allergic rhinitis due to animal (cat) (dog) hair and dander: Secondary | ICD-10-CM | POA: Diagnosis not present

## 2023-11-29 DIAGNOSIS — J3089 Other allergic rhinitis: Secondary | ICD-10-CM | POA: Diagnosis not present

## 2023-12-03 DIAGNOSIS — C61 Malignant neoplasm of prostate: Secondary | ICD-10-CM | POA: Diagnosis not present

## 2023-12-03 DIAGNOSIS — Z79899 Other long term (current) drug therapy: Secondary | ICD-10-CM | POA: Diagnosis not present

## 2023-12-06 DIAGNOSIS — F411 Generalized anxiety disorder: Secondary | ICD-10-CM | POA: Diagnosis not present

## 2023-12-06 DIAGNOSIS — F331 Major depressive disorder, recurrent, moderate: Secondary | ICD-10-CM | POA: Diagnosis not present

## 2023-12-06 DIAGNOSIS — F410 Panic disorder [episodic paroxysmal anxiety] without agoraphobia: Secondary | ICD-10-CM | POA: Diagnosis not present

## 2023-12-10 DIAGNOSIS — J301 Allergic rhinitis due to pollen: Secondary | ICD-10-CM | POA: Diagnosis not present

## 2023-12-10 DIAGNOSIS — J3081 Allergic rhinitis due to animal (cat) (dog) hair and dander: Secondary | ICD-10-CM | POA: Diagnosis not present

## 2023-12-10 DIAGNOSIS — J3089 Other allergic rhinitis: Secondary | ICD-10-CM | POA: Diagnosis not present

## 2023-12-17 DIAGNOSIS — J3089 Other allergic rhinitis: Secondary | ICD-10-CM | POA: Diagnosis not present

## 2023-12-17 DIAGNOSIS — J3081 Allergic rhinitis due to animal (cat) (dog) hair and dander: Secondary | ICD-10-CM | POA: Diagnosis not present

## 2023-12-17 DIAGNOSIS — J301 Allergic rhinitis due to pollen: Secondary | ICD-10-CM | POA: Diagnosis not present

## 2023-12-24 DIAGNOSIS — J3089 Other allergic rhinitis: Secondary | ICD-10-CM | POA: Diagnosis not present

## 2023-12-24 DIAGNOSIS — J301 Allergic rhinitis due to pollen: Secondary | ICD-10-CM | POA: Diagnosis not present

## 2023-12-24 DIAGNOSIS — J3081 Allergic rhinitis due to animal (cat) (dog) hair and dander: Secondary | ICD-10-CM | POA: Diagnosis not present

## 2023-12-31 DIAGNOSIS — J3089 Other allergic rhinitis: Secondary | ICD-10-CM | POA: Diagnosis not present

## 2023-12-31 DIAGNOSIS — J3081 Allergic rhinitis due to animal (cat) (dog) hair and dander: Secondary | ICD-10-CM | POA: Diagnosis not present

## 2023-12-31 DIAGNOSIS — J301 Allergic rhinitis due to pollen: Secondary | ICD-10-CM | POA: Diagnosis not present

## 2024-01-06 DIAGNOSIS — Z79818 Long term (current) use of other agents affecting estrogen receptors and estrogen levels: Secondary | ICD-10-CM | POA: Diagnosis not present

## 2024-01-06 DIAGNOSIS — R351 Nocturia: Secondary | ICD-10-CM | POA: Diagnosis not present

## 2024-01-06 DIAGNOSIS — C61 Malignant neoplasm of prostate: Secondary | ICD-10-CM | POA: Diagnosis not present

## 2024-01-06 DIAGNOSIS — R5383 Other fatigue: Secondary | ICD-10-CM | POA: Diagnosis not present

## 2024-01-06 DIAGNOSIS — R972 Elevated prostate specific antigen [PSA]: Secondary | ICD-10-CM | POA: Diagnosis not present

## 2024-01-06 DIAGNOSIS — Z79899 Other long term (current) drug therapy: Secondary | ICD-10-CM | POA: Diagnosis not present

## 2024-01-07 DIAGNOSIS — J301 Allergic rhinitis due to pollen: Secondary | ICD-10-CM | POA: Diagnosis not present

## 2024-01-07 DIAGNOSIS — J3081 Allergic rhinitis due to animal (cat) (dog) hair and dander: Secondary | ICD-10-CM | POA: Diagnosis not present

## 2024-01-07 DIAGNOSIS — J3089 Other allergic rhinitis: Secondary | ICD-10-CM | POA: Diagnosis not present

## 2024-01-11 ENCOUNTER — Other Ambulatory Visit: Payer: Self-pay | Admitting: Family Medicine

## 2024-01-13 DIAGNOSIS — C61 Malignant neoplasm of prostate: Secondary | ICD-10-CM | POA: Diagnosis not present

## 2024-01-14 DIAGNOSIS — J3081 Allergic rhinitis due to animal (cat) (dog) hair and dander: Secondary | ICD-10-CM | POA: Diagnosis not present

## 2024-01-14 DIAGNOSIS — J301 Allergic rhinitis due to pollen: Secondary | ICD-10-CM | POA: Diagnosis not present

## 2024-01-14 DIAGNOSIS — J3089 Other allergic rhinitis: Secondary | ICD-10-CM | POA: Diagnosis not present

## 2024-01-20 DIAGNOSIS — C61 Malignant neoplasm of prostate: Secondary | ICD-10-CM | POA: Diagnosis not present

## 2024-01-21 DIAGNOSIS — J3089 Other allergic rhinitis: Secondary | ICD-10-CM | POA: Diagnosis not present

## 2024-01-21 DIAGNOSIS — C61 Malignant neoplasm of prostate: Secondary | ICD-10-CM | POA: Diagnosis not present

## 2024-01-21 DIAGNOSIS — J3081 Allergic rhinitis due to animal (cat) (dog) hair and dander: Secondary | ICD-10-CM | POA: Diagnosis not present

## 2024-01-21 DIAGNOSIS — J301 Allergic rhinitis due to pollen: Secondary | ICD-10-CM | POA: Diagnosis not present

## 2024-01-22 DIAGNOSIS — C61 Malignant neoplasm of prostate: Secondary | ICD-10-CM | POA: Diagnosis not present

## 2024-01-23 DIAGNOSIS — C61 Malignant neoplasm of prostate: Secondary | ICD-10-CM | POA: Diagnosis not present

## 2024-01-24 DIAGNOSIS — C61 Malignant neoplasm of prostate: Secondary | ICD-10-CM | POA: Diagnosis not present

## 2024-01-27 DIAGNOSIS — C61 Malignant neoplasm of prostate: Secondary | ICD-10-CM | POA: Diagnosis not present

## 2024-01-28 DIAGNOSIS — J301 Allergic rhinitis due to pollen: Secondary | ICD-10-CM | POA: Diagnosis not present

## 2024-01-28 DIAGNOSIS — J3089 Other allergic rhinitis: Secondary | ICD-10-CM | POA: Diagnosis not present

## 2024-01-28 DIAGNOSIS — C61 Malignant neoplasm of prostate: Secondary | ICD-10-CM | POA: Diagnosis not present

## 2024-01-28 DIAGNOSIS — J3081 Allergic rhinitis due to animal (cat) (dog) hair and dander: Secondary | ICD-10-CM | POA: Diagnosis not present

## 2024-01-29 DIAGNOSIS — C61 Malignant neoplasm of prostate: Secondary | ICD-10-CM | POA: Diagnosis not present

## 2024-01-30 DIAGNOSIS — C61 Malignant neoplasm of prostate: Secondary | ICD-10-CM | POA: Diagnosis not present

## 2024-01-31 DIAGNOSIS — F411 Generalized anxiety disorder: Secondary | ICD-10-CM | POA: Diagnosis not present

## 2024-01-31 DIAGNOSIS — C61 Malignant neoplasm of prostate: Secondary | ICD-10-CM | POA: Diagnosis not present

## 2024-01-31 DIAGNOSIS — F410 Panic disorder [episodic paroxysmal anxiety] without agoraphobia: Secondary | ICD-10-CM | POA: Diagnosis not present

## 2024-01-31 DIAGNOSIS — F331 Major depressive disorder, recurrent, moderate: Secondary | ICD-10-CM | POA: Diagnosis not present

## 2024-02-03 DIAGNOSIS — C61 Malignant neoplasm of prostate: Secondary | ICD-10-CM | POA: Diagnosis not present

## 2024-02-04 DIAGNOSIS — J3081 Allergic rhinitis due to animal (cat) (dog) hair and dander: Secondary | ICD-10-CM | POA: Diagnosis not present

## 2024-02-04 DIAGNOSIS — C61 Malignant neoplasm of prostate: Secondary | ICD-10-CM | POA: Diagnosis not present

## 2024-02-04 DIAGNOSIS — J301 Allergic rhinitis due to pollen: Secondary | ICD-10-CM | POA: Diagnosis not present

## 2024-02-04 DIAGNOSIS — J3089 Other allergic rhinitis: Secondary | ICD-10-CM | POA: Diagnosis not present

## 2024-02-05 DIAGNOSIS — C61 Malignant neoplasm of prostate: Secondary | ICD-10-CM | POA: Diagnosis not present

## 2024-02-06 DIAGNOSIS — C61 Malignant neoplasm of prostate: Secondary | ICD-10-CM | POA: Diagnosis not present

## 2024-02-07 DIAGNOSIS — C61 Malignant neoplasm of prostate: Secondary | ICD-10-CM | POA: Diagnosis not present

## 2024-02-10 DIAGNOSIS — C61 Malignant neoplasm of prostate: Secondary | ICD-10-CM | POA: Diagnosis not present

## 2024-02-11 DIAGNOSIS — C61 Malignant neoplasm of prostate: Secondary | ICD-10-CM | POA: Diagnosis not present

## 2024-02-11 DIAGNOSIS — J3089 Other allergic rhinitis: Secondary | ICD-10-CM | POA: Diagnosis not present

## 2024-02-11 DIAGNOSIS — J3081 Allergic rhinitis due to animal (cat) (dog) hair and dander: Secondary | ICD-10-CM | POA: Diagnosis not present

## 2024-02-11 DIAGNOSIS — J301 Allergic rhinitis due to pollen: Secondary | ICD-10-CM | POA: Diagnosis not present

## 2024-02-12 DIAGNOSIS — C61 Malignant neoplasm of prostate: Secondary | ICD-10-CM | POA: Diagnosis not present

## 2024-02-13 DIAGNOSIS — C61 Malignant neoplasm of prostate: Secondary | ICD-10-CM | POA: Diagnosis not present

## 2024-02-14 DIAGNOSIS — C61 Malignant neoplasm of prostate: Secondary | ICD-10-CM | POA: Diagnosis not present

## 2024-02-17 DIAGNOSIS — C61 Malignant neoplasm of prostate: Secondary | ICD-10-CM | POA: Diagnosis not present

## 2024-02-18 DIAGNOSIS — J3089 Other allergic rhinitis: Secondary | ICD-10-CM | POA: Diagnosis not present

## 2024-02-18 DIAGNOSIS — J3081 Allergic rhinitis due to animal (cat) (dog) hair and dander: Secondary | ICD-10-CM | POA: Diagnosis not present

## 2024-02-18 DIAGNOSIS — C61 Malignant neoplasm of prostate: Secondary | ICD-10-CM | POA: Diagnosis not present

## 2024-02-18 DIAGNOSIS — J301 Allergic rhinitis due to pollen: Secondary | ICD-10-CM | POA: Diagnosis not present

## 2024-02-19 DIAGNOSIS — C61 Malignant neoplasm of prostate: Secondary | ICD-10-CM | POA: Diagnosis not present

## 2024-02-20 DIAGNOSIS — C61 Malignant neoplasm of prostate: Secondary | ICD-10-CM | POA: Diagnosis not present

## 2024-02-21 DIAGNOSIS — C61 Malignant neoplasm of prostate: Secondary | ICD-10-CM | POA: Diagnosis not present

## 2024-02-25 DIAGNOSIS — J3081 Allergic rhinitis due to animal (cat) (dog) hair and dander: Secondary | ICD-10-CM | POA: Diagnosis not present

## 2024-02-25 DIAGNOSIS — J3089 Other allergic rhinitis: Secondary | ICD-10-CM | POA: Diagnosis not present

## 2024-02-25 DIAGNOSIS — J301 Allergic rhinitis due to pollen: Secondary | ICD-10-CM | POA: Diagnosis not present

## 2024-02-25 DIAGNOSIS — C61 Malignant neoplasm of prostate: Secondary | ICD-10-CM | POA: Diagnosis not present

## 2024-02-26 DIAGNOSIS — C61 Malignant neoplasm of prostate: Secondary | ICD-10-CM | POA: Diagnosis not present

## 2024-02-27 DIAGNOSIS — C61 Malignant neoplasm of prostate: Secondary | ICD-10-CM | POA: Diagnosis not present

## 2024-03-03 DIAGNOSIS — J3089 Other allergic rhinitis: Secondary | ICD-10-CM | POA: Diagnosis not present

## 2024-03-03 DIAGNOSIS — J301 Allergic rhinitis due to pollen: Secondary | ICD-10-CM | POA: Diagnosis not present

## 2024-03-03 DIAGNOSIS — J3081 Allergic rhinitis due to animal (cat) (dog) hair and dander: Secondary | ICD-10-CM | POA: Diagnosis not present

## 2024-03-10 DIAGNOSIS — H43812 Vitreous degeneration, left eye: Secondary | ICD-10-CM | POA: Diagnosis not present

## 2024-03-10 DIAGNOSIS — J3089 Other allergic rhinitis: Secondary | ICD-10-CM | POA: Diagnosis not present

## 2024-03-10 DIAGNOSIS — E119 Type 2 diabetes mellitus without complications: Secondary | ICD-10-CM | POA: Diagnosis not present

## 2024-03-10 DIAGNOSIS — H2512 Age-related nuclear cataract, left eye: Secondary | ICD-10-CM | POA: Diagnosis not present

## 2024-03-10 DIAGNOSIS — J301 Allergic rhinitis due to pollen: Secondary | ICD-10-CM | POA: Diagnosis not present

## 2024-03-10 DIAGNOSIS — J3081 Allergic rhinitis due to animal (cat) (dog) hair and dander: Secondary | ICD-10-CM | POA: Diagnosis not present

## 2024-03-11 DIAGNOSIS — F331 Major depressive disorder, recurrent, moderate: Secondary | ICD-10-CM | POA: Diagnosis not present

## 2024-03-11 DIAGNOSIS — F410 Panic disorder [episodic paroxysmal anxiety] without agoraphobia: Secondary | ICD-10-CM | POA: Diagnosis not present

## 2024-03-11 DIAGNOSIS — F411 Generalized anxiety disorder: Secondary | ICD-10-CM | POA: Diagnosis not present

## 2024-03-17 DIAGNOSIS — R351 Nocturia: Secondary | ICD-10-CM | POA: Diagnosis not present

## 2024-03-17 DIAGNOSIS — R5383 Other fatigue: Secondary | ICD-10-CM | POA: Diagnosis not present

## 2024-03-17 DIAGNOSIS — J3081 Allergic rhinitis due to animal (cat) (dog) hair and dander: Secondary | ICD-10-CM | POA: Diagnosis not present

## 2024-03-17 DIAGNOSIS — J301 Allergic rhinitis due to pollen: Secondary | ICD-10-CM | POA: Diagnosis not present

## 2024-03-17 DIAGNOSIS — J3089 Other allergic rhinitis: Secondary | ICD-10-CM | POA: Diagnosis not present

## 2024-03-17 DIAGNOSIS — Z79899 Other long term (current) drug therapy: Secondary | ICD-10-CM | POA: Diagnosis not present

## 2024-03-17 DIAGNOSIS — C61 Malignant neoplasm of prostate: Secondary | ICD-10-CM | POA: Diagnosis not present

## 2024-03-24 DIAGNOSIS — J301 Allergic rhinitis due to pollen: Secondary | ICD-10-CM | POA: Diagnosis not present

## 2024-03-24 DIAGNOSIS — J3081 Allergic rhinitis due to animal (cat) (dog) hair and dander: Secondary | ICD-10-CM | POA: Diagnosis not present

## 2024-03-24 DIAGNOSIS — J3089 Other allergic rhinitis: Secondary | ICD-10-CM | POA: Diagnosis not present

## 2024-03-31 DIAGNOSIS — J3089 Other allergic rhinitis: Secondary | ICD-10-CM | POA: Diagnosis not present

## 2024-03-31 DIAGNOSIS — J3081 Allergic rhinitis due to animal (cat) (dog) hair and dander: Secondary | ICD-10-CM | POA: Diagnosis not present

## 2024-03-31 DIAGNOSIS — J301 Allergic rhinitis due to pollen: Secondary | ICD-10-CM | POA: Diagnosis not present

## 2024-04-07 DIAGNOSIS — J3081 Allergic rhinitis due to animal (cat) (dog) hair and dander: Secondary | ICD-10-CM | POA: Diagnosis not present

## 2024-04-07 DIAGNOSIS — J3089 Other allergic rhinitis: Secondary | ICD-10-CM | POA: Diagnosis not present

## 2024-04-07 DIAGNOSIS — J301 Allergic rhinitis due to pollen: Secondary | ICD-10-CM | POA: Diagnosis not present

## 2024-04-10 ENCOUNTER — Other Ambulatory Visit: Payer: Self-pay | Admitting: Family Medicine

## 2024-04-14 DIAGNOSIS — J3081 Allergic rhinitis due to animal (cat) (dog) hair and dander: Secondary | ICD-10-CM | POA: Diagnosis not present

## 2024-04-14 DIAGNOSIS — J301 Allergic rhinitis due to pollen: Secondary | ICD-10-CM | POA: Diagnosis not present

## 2024-04-14 DIAGNOSIS — J3089 Other allergic rhinitis: Secondary | ICD-10-CM | POA: Diagnosis not present

## 2024-04-15 DIAGNOSIS — F331 Major depressive disorder, recurrent, moderate: Secondary | ICD-10-CM | POA: Diagnosis not present

## 2024-04-15 DIAGNOSIS — F410 Panic disorder [episodic paroxysmal anxiety] without agoraphobia: Secondary | ICD-10-CM | POA: Diagnosis not present

## 2024-04-15 DIAGNOSIS — F411 Generalized anxiety disorder: Secondary | ICD-10-CM | POA: Diagnosis not present

## 2024-04-21 DIAGNOSIS — J3081 Allergic rhinitis due to animal (cat) (dog) hair and dander: Secondary | ICD-10-CM | POA: Diagnosis not present

## 2024-04-21 DIAGNOSIS — J301 Allergic rhinitis due to pollen: Secondary | ICD-10-CM | POA: Diagnosis not present

## 2024-04-21 DIAGNOSIS — J3089 Other allergic rhinitis: Secondary | ICD-10-CM | POA: Diagnosis not present

## 2024-05-05 DIAGNOSIS — J301 Allergic rhinitis due to pollen: Secondary | ICD-10-CM | POA: Diagnosis not present

## 2024-05-05 DIAGNOSIS — J3089 Other allergic rhinitis: Secondary | ICD-10-CM | POA: Diagnosis not present

## 2024-05-12 DIAGNOSIS — J3089 Other allergic rhinitis: Secondary | ICD-10-CM | POA: Diagnosis not present

## 2024-05-12 DIAGNOSIS — J301 Allergic rhinitis due to pollen: Secondary | ICD-10-CM | POA: Diagnosis not present

## 2024-05-12 DIAGNOSIS — J3081 Allergic rhinitis due to animal (cat) (dog) hair and dander: Secondary | ICD-10-CM | POA: Diagnosis not present

## 2024-05-14 DIAGNOSIS — J301 Allergic rhinitis due to pollen: Secondary | ICD-10-CM | POA: Diagnosis not present

## 2024-05-19 DIAGNOSIS — J301 Allergic rhinitis due to pollen: Secondary | ICD-10-CM | POA: Diagnosis not present

## 2024-05-19 DIAGNOSIS — J3089 Other allergic rhinitis: Secondary | ICD-10-CM | POA: Diagnosis not present

## 2024-05-19 DIAGNOSIS — J3081 Allergic rhinitis due to animal (cat) (dog) hair and dander: Secondary | ICD-10-CM | POA: Diagnosis not present

## 2024-05-26 DIAGNOSIS — E895 Postprocedural testicular hypofunction: Secondary | ICD-10-CM | POA: Diagnosis not present

## 2024-05-26 DIAGNOSIS — Z79899 Other long term (current) drug therapy: Secondary | ICD-10-CM | POA: Diagnosis not present

## 2024-05-26 DIAGNOSIS — Z5111 Encounter for antineoplastic chemotherapy: Secondary | ICD-10-CM | POA: Diagnosis not present

## 2024-05-26 DIAGNOSIS — R232 Flushing: Secondary | ICD-10-CM | POA: Diagnosis not present

## 2024-05-26 DIAGNOSIS — R351 Nocturia: Secondary | ICD-10-CM | POA: Diagnosis not present

## 2024-05-26 DIAGNOSIS — C61 Malignant neoplasm of prostate: Secondary | ICD-10-CM | POA: Diagnosis not present

## 2024-05-27 DIAGNOSIS — F331 Major depressive disorder, recurrent, moderate: Secondary | ICD-10-CM | POA: Diagnosis not present

## 2024-05-27 DIAGNOSIS — F410 Panic disorder [episodic paroxysmal anxiety] without agoraphobia: Secondary | ICD-10-CM | POA: Diagnosis not present

## 2024-05-27 DIAGNOSIS — F411 Generalized anxiety disorder: Secondary | ICD-10-CM | POA: Diagnosis not present

## 2024-06-02 DIAGNOSIS — J301 Allergic rhinitis due to pollen: Secondary | ICD-10-CM | POA: Diagnosis not present

## 2024-06-02 DIAGNOSIS — J3089 Other allergic rhinitis: Secondary | ICD-10-CM | POA: Diagnosis not present

## 2024-06-02 DIAGNOSIS — J3081 Allergic rhinitis due to animal (cat) (dog) hair and dander: Secondary | ICD-10-CM | POA: Diagnosis not present

## 2024-06-09 DIAGNOSIS — J3081 Allergic rhinitis due to animal (cat) (dog) hair and dander: Secondary | ICD-10-CM | POA: Diagnosis not present

## 2024-06-09 DIAGNOSIS — J301 Allergic rhinitis due to pollen: Secondary | ICD-10-CM | POA: Diagnosis not present

## 2024-06-09 DIAGNOSIS — J3089 Other allergic rhinitis: Secondary | ICD-10-CM | POA: Diagnosis not present

## 2024-06-16 DIAGNOSIS — K4020 Bilateral inguinal hernia, without obstruction or gangrene, not specified as recurrent: Secondary | ICD-10-CM | POA: Diagnosis not present

## 2024-06-16 DIAGNOSIS — J301 Allergic rhinitis due to pollen: Secondary | ICD-10-CM | POA: Diagnosis not present

## 2024-06-16 DIAGNOSIS — J3081 Allergic rhinitis due to animal (cat) (dog) hair and dander: Secondary | ICD-10-CM | POA: Diagnosis not present

## 2024-06-16 DIAGNOSIS — J3089 Other allergic rhinitis: Secondary | ICD-10-CM | POA: Diagnosis not present

## 2024-06-23 DIAGNOSIS — J3089 Other allergic rhinitis: Secondary | ICD-10-CM | POA: Diagnosis not present

## 2024-06-23 DIAGNOSIS — J3081 Allergic rhinitis due to animal (cat) (dog) hair and dander: Secondary | ICD-10-CM | POA: Diagnosis not present

## 2024-06-23 DIAGNOSIS — J301 Allergic rhinitis due to pollen: Secondary | ICD-10-CM | POA: Diagnosis not present

## 2024-06-29 DIAGNOSIS — K4020 Bilateral inguinal hernia, without obstruction or gangrene, not specified as recurrent: Secondary | ICD-10-CM | POA: Diagnosis not present

## 2024-07-08 DIAGNOSIS — F331 Major depressive disorder, recurrent, moderate: Secondary | ICD-10-CM | POA: Diagnosis not present

## 2024-07-08 DIAGNOSIS — F410 Panic disorder [episodic paroxysmal anxiety] without agoraphobia: Secondary | ICD-10-CM | POA: Diagnosis not present

## 2024-07-08 DIAGNOSIS — F411 Generalized anxiety disorder: Secondary | ICD-10-CM | POA: Diagnosis not present

## 2024-07-09 DIAGNOSIS — J301 Allergic rhinitis due to pollen: Secondary | ICD-10-CM | POA: Diagnosis not present

## 2024-07-09 DIAGNOSIS — J3081 Allergic rhinitis due to animal (cat) (dog) hair and dander: Secondary | ICD-10-CM | POA: Diagnosis not present

## 2024-07-09 DIAGNOSIS — J3089 Other allergic rhinitis: Secondary | ICD-10-CM | POA: Diagnosis not present

## 2024-07-11 ENCOUNTER — Other Ambulatory Visit: Payer: Self-pay | Admitting: Family Medicine

## 2024-07-14 DIAGNOSIS — J301 Allergic rhinitis due to pollen: Secondary | ICD-10-CM | POA: Diagnosis not present

## 2024-07-14 DIAGNOSIS — J3081 Allergic rhinitis due to animal (cat) (dog) hair and dander: Secondary | ICD-10-CM | POA: Diagnosis not present

## 2024-07-14 DIAGNOSIS — J3089 Other allergic rhinitis: Secondary | ICD-10-CM | POA: Diagnosis not present

## 2024-07-21 DIAGNOSIS — K4020 Bilateral inguinal hernia, without obstruction or gangrene, not specified as recurrent: Secondary | ICD-10-CM | POA: Diagnosis not present

## 2024-07-21 DIAGNOSIS — J3089 Other allergic rhinitis: Secondary | ICD-10-CM | POA: Diagnosis not present

## 2024-07-21 DIAGNOSIS — J3081 Allergic rhinitis due to animal (cat) (dog) hair and dander: Secondary | ICD-10-CM | POA: Diagnosis not present

## 2024-07-21 DIAGNOSIS — Z48815 Encounter for surgical aftercare following surgery on the digestive system: Secondary | ICD-10-CM | POA: Diagnosis not present

## 2024-07-21 DIAGNOSIS — J301 Allergic rhinitis due to pollen: Secondary | ICD-10-CM | POA: Diagnosis not present

## 2024-07-28 DIAGNOSIS — J3081 Allergic rhinitis due to animal (cat) (dog) hair and dander: Secondary | ICD-10-CM | POA: Diagnosis not present

## 2024-07-28 DIAGNOSIS — J3089 Other allergic rhinitis: Secondary | ICD-10-CM | POA: Diagnosis not present

## 2024-07-28 DIAGNOSIS — J301 Allergic rhinitis due to pollen: Secondary | ICD-10-CM | POA: Diagnosis not present

## 2024-07-29 ENCOUNTER — Other Ambulatory Visit: Payer: Self-pay | Admitting: Family Medicine

## 2024-07-29 DIAGNOSIS — J301 Allergic rhinitis due to pollen: Secondary | ICD-10-CM

## 2024-08-04 DIAGNOSIS — J3081 Allergic rhinitis due to animal (cat) (dog) hair and dander: Secondary | ICD-10-CM | POA: Diagnosis not present

## 2024-08-04 DIAGNOSIS — J301 Allergic rhinitis due to pollen: Secondary | ICD-10-CM | POA: Diagnosis not present

## 2024-08-04 DIAGNOSIS — J3089 Other allergic rhinitis: Secondary | ICD-10-CM | POA: Diagnosis not present

## 2024-08-07 ENCOUNTER — Other Ambulatory Visit: Payer: Self-pay | Admitting: Family Medicine

## 2024-08-12 ENCOUNTER — Other Ambulatory Visit: Payer: Self-pay | Admitting: Family Medicine

## 2024-08-19 DIAGNOSIS — F331 Major depressive disorder, recurrent, moderate: Secondary | ICD-10-CM | POA: Diagnosis not present

## 2024-08-19 DIAGNOSIS — F411 Generalized anxiety disorder: Secondary | ICD-10-CM | POA: Diagnosis not present

## 2024-08-19 DIAGNOSIS — F410 Panic disorder [episodic paroxysmal anxiety] without agoraphobia: Secondary | ICD-10-CM | POA: Diagnosis not present

## 2024-08-20 DIAGNOSIS — J3081 Allergic rhinitis due to animal (cat) (dog) hair and dander: Secondary | ICD-10-CM | POA: Diagnosis not present

## 2024-08-20 DIAGNOSIS — J3089 Other allergic rhinitis: Secondary | ICD-10-CM | POA: Diagnosis not present

## 2024-08-20 DIAGNOSIS — H1045 Other chronic allergic conjunctivitis: Secondary | ICD-10-CM | POA: Diagnosis not present

## 2024-08-20 DIAGNOSIS — J301 Allergic rhinitis due to pollen: Secondary | ICD-10-CM | POA: Diagnosis not present

## 2024-08-25 DIAGNOSIS — J3081 Allergic rhinitis due to animal (cat) (dog) hair and dander: Secondary | ICD-10-CM | POA: Diagnosis not present

## 2024-08-25 DIAGNOSIS — J3089 Other allergic rhinitis: Secondary | ICD-10-CM | POA: Diagnosis not present

## 2024-08-25 DIAGNOSIS — J301 Allergic rhinitis due to pollen: Secondary | ICD-10-CM | POA: Diagnosis not present

## 2024-09-01 DIAGNOSIS — J3081 Allergic rhinitis due to animal (cat) (dog) hair and dander: Secondary | ICD-10-CM | POA: Diagnosis not present

## 2024-09-01 DIAGNOSIS — J301 Allergic rhinitis due to pollen: Secondary | ICD-10-CM | POA: Diagnosis not present

## 2024-09-01 DIAGNOSIS — J3089 Other allergic rhinitis: Secondary | ICD-10-CM | POA: Diagnosis not present

## 2024-09-07 DIAGNOSIS — C61 Malignant neoplasm of prostate: Secondary | ICD-10-CM | POA: Diagnosis not present

## 2024-09-08 DIAGNOSIS — J3089 Other allergic rhinitis: Secondary | ICD-10-CM | POA: Diagnosis not present

## 2024-09-08 DIAGNOSIS — J3081 Allergic rhinitis due to animal (cat) (dog) hair and dander: Secondary | ICD-10-CM | POA: Diagnosis not present

## 2024-09-08 DIAGNOSIS — J301 Allergic rhinitis due to pollen: Secondary | ICD-10-CM | POA: Diagnosis not present

## 2024-09-09 ENCOUNTER — Other Ambulatory Visit: Payer: Self-pay | Admitting: Family Medicine

## 2024-09-15 DIAGNOSIS — J3089 Other allergic rhinitis: Secondary | ICD-10-CM | POA: Diagnosis not present

## 2024-09-15 DIAGNOSIS — J301 Allergic rhinitis due to pollen: Secondary | ICD-10-CM | POA: Diagnosis not present

## 2024-09-15 DIAGNOSIS — J3081 Allergic rhinitis due to animal (cat) (dog) hair and dander: Secondary | ICD-10-CM | POA: Diagnosis not present

## 2024-09-23 DIAGNOSIS — Z0120 Encounter for dental examination and cleaning without abnormal findings: Secondary | ICD-10-CM | POA: Diagnosis not present

## 2024-09-24 DIAGNOSIS — J3081 Allergic rhinitis due to animal (cat) (dog) hair and dander: Secondary | ICD-10-CM | POA: Diagnosis not present

## 2024-09-24 DIAGNOSIS — J301 Allergic rhinitis due to pollen: Secondary | ICD-10-CM | POA: Diagnosis not present

## 2024-09-24 DIAGNOSIS — J3089 Other allergic rhinitis: Secondary | ICD-10-CM | POA: Diagnosis not present

## 2024-09-29 DIAGNOSIS — J3081 Allergic rhinitis due to animal (cat) (dog) hair and dander: Secondary | ICD-10-CM | POA: Diagnosis not present

## 2024-09-29 DIAGNOSIS — J3089 Other allergic rhinitis: Secondary | ICD-10-CM | POA: Diagnosis not present

## 2024-09-29 DIAGNOSIS — J301 Allergic rhinitis due to pollen: Secondary | ICD-10-CM | POA: Diagnosis not present

## 2024-10-06 DIAGNOSIS — J3081 Allergic rhinitis due to animal (cat) (dog) hair and dander: Secondary | ICD-10-CM | POA: Diagnosis not present

## 2024-10-06 DIAGNOSIS — J3089 Other allergic rhinitis: Secondary | ICD-10-CM | POA: Diagnosis not present

## 2024-10-06 DIAGNOSIS — J301 Allergic rhinitis due to pollen: Secondary | ICD-10-CM | POA: Diagnosis not present

## 2024-10-28 ENCOUNTER — Telehealth: Payer: Self-pay

## 2024-10-28 NOTE — Telephone Encounter (Signed)
 Copied from CRM [id number]. Topic: Clinical - Lab/Test Results >> Oct 28, 2024 11:23 AM Kurt Edwards wrote: Patient wants lab order panel put in

## 2024-10-28 NOTE — Telephone Encounter (Signed)
 Please schedule appointment.

## 2024-10-28 NOTE — Telephone Encounter (Signed)
 I have schedule pt an appt for 11/11/24 at 8 am. Pt is aware and verbalized understanding.

## 2024-10-28 NOTE — Telephone Encounter (Signed)
 Ok to advise pt to schedule an appt. last OV 07/12/2023.

## 2024-11-11 ENCOUNTER — Ambulatory Visit: Admitting: Family Medicine

## 2024-11-11 ENCOUNTER — Encounter: Payer: Self-pay | Admitting: Family Medicine

## 2024-11-11 VITALS — BP 113/74 | HR 81 | Resp 16 | Ht 72.0 in | Wt 190.0 lb

## 2024-11-11 DIAGNOSIS — R7309 Other abnormal glucose: Secondary | ICD-10-CM | POA: Diagnosis not present

## 2024-11-11 DIAGNOSIS — E785 Hyperlipidemia, unspecified: Secondary | ICD-10-CM

## 2024-11-11 DIAGNOSIS — F419 Anxiety disorder, unspecified: Secondary | ICD-10-CM

## 2024-11-11 DIAGNOSIS — C61 Malignant neoplasm of prostate: Secondary | ICD-10-CM | POA: Diagnosis not present

## 2024-11-11 DIAGNOSIS — I10 Essential (primary) hypertension: Secondary | ICD-10-CM

## 2024-11-11 DIAGNOSIS — Z23 Encounter for immunization: Secondary | ICD-10-CM | POA: Diagnosis not present

## 2024-11-11 NOTE — Progress Notes (Signed)
 "     Established patient visit   Patient: Kurt Edwards   DOB: 02/07/1956   69 y.o. Male  MRN: [id number] Visit Date: 11/11/2024  Today's healthcare provider: Nancyann Perry, MD   Chief Complaint  Patient presents with   Annual Exam    CPE   Pt wants labs done chloest andd iron.   Subjective    Discussed the use of AI scribe software for clinical note transcription with the patient, who gave verbal consent to proceed.  History of Present Illness   Kurt Edwards is a 69 year old male with prostate cancer who presents for a follow up of hypertension and hyperlipidemia.   He has completed radiation treatment for prostate cancer and is currently receiving Zoladex injections, with the last shot scheduled for February.  He monitors his blood pressure during visits to [facility], where it has been consistently normal. He is currently taking rosuvastatin  for cholesterol management.  He is also on Zyprexa, Lamictal, Prestiq and Wellbutrin, and his mood has been stable without significant stress or anxiety. Prescribed by PA at [facility].  He mentions feeling tired since his treatment.  No chest pain, heart flutters, or shortness of breath. He undergoes regular blood work every three months, which includes monitoring of his blood counts. He has not had issues with low iron levels.  He has not eaten today in preparation for lab work to check his A1c and cholesterol levels.     Lab Results  Component Value Date   HGBA1C 5.8 (H) 08/01/2023   HGBA1C 5.9 (H) 02/13/2022   HGBA1C 5.7 (A) 09/05/2021    Medications: Show/hide medication list[1] Review of Systems  Constitutional:  Negative for appetite change, chills and fever.  Respiratory:  Negative for chest tightness, shortness of breath and wheezing.   Cardiovascular:  Negative for chest pain and palpitations.  Gastrointestinal:  Negative for abdominal pain, nausea and vomiting.       Objective    BP 113/74 (BP Location: Left Arm,  Patient Position: Sitting, Cuff Size: Normal)   Pulse 81   Resp 16   Ht 6' (1.829 m)   Wt 190 lb (86.2 kg)   SpO2 98%   BMI 25.77 kg/m   Physical Exam   General: Appearance:    Well developed, well nourished male in no acute distress  Eyes:    PERRL, conjunctiva/corneas clear, EOM's intact       Lungs:     Clear to auscultation bilaterally, respirations unlabored  Heart:    Normal heart rate. Normal rhythm. No murmurs, rubs, or gallops.    MS:   All extremities are intact.    Neurologic:   Awake, alert, oriented x 3. No apparent focal neurological defect.        Assessment & Plan         - Recommended Prevnar vaccine which he declined.  - Recommended flu shot which he declined.   Malignant neoplasm of prostate Completed radiation therapy. Zoladex injections ongoing with last dose in February. Blood work indicates effective treatment.  Primary hypertension Blood pressure is well-controlled.  Hyperlipidemia Continues on rosuvastatin  with no issues.  Check A1c due to history of hyperglycemia.   Anxiety disorder Mood stable, no significant stress or anxiety. Managed by PA at [facility] with lamictal Zyprexa and Wellbutrin.         Nancyann Perry, MD  [facility] [phone] (phone) [phone] (fax)  [facility]    [  1]  Outpatient Medications Prior to Visit  Medication Sig   ARIPiprazole (ABILIFY) 2 MG tablet Take 2 mg by mouth daily.   buPROPion (WELLBUTRIN XL) 150 MG 24 hr tablet Take 150 mg by mouth every morning.   desvenlafaxine (PRISTIQ) 100 MG 24 hr tablet Take 100 mg by mouth daily.   Desvenlafaxine ER 100 MG TB24 Take 1 tablet by mouth daily.   diazepam  (VALIUM ) 10 MG tablet Take 1 tablet (10 mg total) by mouth once as needed for up to 1 dose for anxiety (take 45 minutes prior to prostate biopsy).   fluticasone  (FLONASE ) 50 MCG/ACT nasal spray SPRAY 2 SPRAYS INTO EACH NOSTRIL EVERY DAY   lamoTRIgine  (LAMICTAL) 200 MG tablet Take 200 mg by mouth daily.   loratadine (CLARITIN) 10 MG tablet Take 10 mg by mouth daily.   losartan  (COZAAR ) 100 MG tablet TAKE 1 TABLET BY MOUTH EVERY DAY   rosuvastatin  (CRESTOR ) 5 MG tablet TAKE 1 TABLET (5 MG TOTAL) BY MOUTH DAILY.   sildenafil  (VIAGRA ) 100 MG tablet Take 1 tablet 1 hour prior to intercourse.   tadalafil  (CIALIS ) 20 MG tablet TAKE ONE TABLET BY MOUTH ONE HOUR PRIOR TO INTERCOURSE. NO MORE THAN ONE IN A 24 HOURS PERIOD   No facility-administered medications prior to visit.   "

## 2024-11-11 NOTE — Patient Instructions (Addendum)
 I recommend that you get the Prevnar 20 vaccine to protect yourself from certain dangerous strains of pneumonia. You can get Prevnar 20 at your pharmacy, or call our office at [phone] at your earliest convenience to schedule this vaccine.

## 2024-11-12 ENCOUNTER — Ambulatory Visit: Payer: Self-pay | Admitting: Family Medicine

## 2024-11-12 LAB — RENAL FUNCTION PANEL
Albumin: 4.1 g/dL (ref 3.9–4.9)
BUN/Creatinine Ratio: 13 (ref 10–24)
BUN: 20 mg/dL (ref 8–27)
CO2: 23 mmol/L (ref 20–29)
Calcium: 9.5 mg/dL (ref 8.6–10.2)
Chloride: 103 mmol/L (ref 96–106)
Creatinine, Ser: 1.51 mg/dL — ABNORMAL HIGH (ref 0.76–1.27)
Glucose: 98 mg/dL (ref 70–99)
Phosphorus: 3.2 mg/dL (ref 2.8–4.1)
Potassium: 5 mmol/L (ref 3.5–5.2)
Sodium: 142 mmol/L (ref 134–144)
eGFR: 50 mL/min/{1.73_m2} — ABNORMAL LOW

## 2024-11-12 LAB — LIPID PANEL
Chol/HDL Ratio: 3.6 ratio (ref 0.0–5.0)
Cholesterol, Total: 163 mg/dL (ref 100–199)
HDL: 45 mg/dL
LDL Chol Calc (NIH): 85 mg/dL (ref 0–99)
Triglycerides: 195 mg/dL — ABNORMAL HIGH (ref 0–149)
VLDL Cholesterol Cal: 33 mg/dL (ref 5–40)

## 2024-11-12 LAB — HEMOGLOBIN A1C
Est. average glucose Bld gHb Est-mCnc: 117 mg/dL
Hgb A1c MFr Bld: 5.7 % — ABNORMAL HIGH (ref 4.8–5.6)

## 2024-11-15 ENCOUNTER — Other Ambulatory Visit: Payer: Self-pay | Admitting: Family Medicine
